# Patient Record
Sex: Male | Born: 1945 | Race: White | Hispanic: No | Marital: Married | Smoking: Former smoker
Health system: Southern US, Community
[De-identification: ages and names within clinical notes are randomized; demographics above are authoritative.]

## PROBLEM LIST (undated history)

## (undated) DIAGNOSIS — M810 Age-related osteoporosis without current pathological fracture: Secondary | ICD-10-CM

## (undated) DIAGNOSIS — J1282 Pneumonia due to coronavirus disease 2019: Secondary | ICD-10-CM

## (undated) DIAGNOSIS — U071 COVID-19: Secondary | ICD-10-CM

## (undated) DIAGNOSIS — R002 Palpitations: Secondary | ICD-10-CM

## (undated) DIAGNOSIS — E119 Type 2 diabetes mellitus without complications: Secondary | ICD-10-CM

## (undated) HISTORY — DX: Age-related osteoporosis without current pathological fracture: M81.0

## (undated) HISTORY — PX: APPENDECTOMY: SHX54

## (undated) HISTORY — DX: Palpitations: R00.2

---

## 2005-07-19 ENCOUNTER — Ambulatory Visit (HOSPITAL_COMMUNITY): Admission: RE | Admit: 2005-07-19 | Discharge: 2005-07-19 | Payer: Self-pay | Admitting: Orthopedic Surgery

## 2005-09-28 ENCOUNTER — Ambulatory Visit (HOSPITAL_COMMUNITY): Admission: RE | Admit: 2005-09-28 | Discharge: 2005-09-28 | Payer: Self-pay | Admitting: Orthopedic Surgery

## 2008-08-06 ENCOUNTER — Inpatient Hospital Stay (HOSPITAL_COMMUNITY): Admission: EM | Admit: 2008-08-06 | Discharge: 2008-08-08 | Payer: Self-pay | Admitting: Emergency Medicine

## 2008-08-08 ENCOUNTER — Encounter (INDEPENDENT_AMBULATORY_CARE_PROVIDER_SITE_OTHER): Payer: Self-pay | Admitting: Gastroenterology

## 2008-12-19 ENCOUNTER — Emergency Department (HOSPITAL_COMMUNITY): Admission: EM | Admit: 2008-12-19 | Discharge: 2008-12-19 | Payer: Self-pay | Admitting: Emergency Medicine

## 2008-12-22 ENCOUNTER — Ambulatory Visit (HOSPITAL_COMMUNITY): Admission: RE | Admit: 2008-12-22 | Discharge: 2008-12-22 | Payer: Self-pay | Admitting: Urology

## 2009-05-02 ENCOUNTER — Encounter: Admission: RE | Admit: 2009-05-02 | Discharge: 2009-05-02 | Payer: Self-pay | Admitting: Specialist

## 2010-05-14 ENCOUNTER — Encounter: Admission: RE | Admit: 2010-05-14 | Discharge: 2010-05-14 | Payer: Self-pay | Admitting: Family Medicine

## 2010-12-22 LAB — COMPREHENSIVE METABOLIC PANEL
ALT: 63 U/L — ABNORMAL HIGH (ref 0–53)
AST: 37 U/L (ref 0–37)
Albumin: 4.1 g/dL (ref 3.5–5.2)
Alkaline Phosphatase: 58 U/L (ref 39–117)
BUN: 19 mg/dL (ref 6–23)
CO2: 28 mEq/L (ref 19–32)
Calcium: 8.8 mg/dL (ref 8.4–10.5)
Chloride: 102 mEq/L (ref 96–112)
Creatinine, Ser: 0.92 mg/dL (ref 0.4–1.5)
GFR calc Af Amer: >60 mL/min (ref >60)
GFR calc non Af Amer: >60 mL/min (ref >60)
Glucose, Bld: 253 mg/dL — ABNORMAL HIGH (ref 70–99)
Potassium: 3.7 mEq/L (ref 3.5–5.1)
Sodium: 137 mEq/L (ref 135–145)
Total Bilirubin: 1.1 mg/dL (ref 0.3–1.2)
Total Protein: 7 g/dL (ref 6.0–8.3)

## 2010-12-22 LAB — CBC
HCT: 44.7 % (ref 39.0–52.0)
Hemoglobin: 15.1 g/dL (ref 13.0–17.0)
MCHC: 33.7 g/dL (ref 30.0–36.0)
MCV: 90.5 fL (ref 78.0–100.0)
Platelets: 167 10*3/uL (ref 150–400)
RBC: 4.94 MIL/uL (ref 4.22–5.81)
RDW: 13.1 % (ref 11.5–15.5)
WBC: 9 10*3/uL (ref 4.0–10.5)

## 2010-12-22 LAB — URINALYSIS, ROUTINE W REFLEX MICROSCOPIC
Bilirubin Urine: NEGATIVE
Glucose, UA: 500 mg/dL — AB
Nitrite: NEGATIVE
Protein, ur: 100 mg/dL — AB
Specific Gravity, Urine: 1.027 (ref 1.005–1.030)
Urobilinogen, UA: 0.2 mg/dL (ref 0.0–1.0)
pH: 6 (ref 5.0–8.0)

## 2010-12-22 LAB — URINE MICROSCOPIC-ADD ON

## 2010-12-22 LAB — DIFFERENTIAL
Basophils Absolute: 0 10*3/uL (ref 0.0–0.1)
Basophils Relative: 1 % (ref 0–1)
Eosinophils Absolute: 0.1 10*3/uL (ref 0.0–0.7)
Eosinophils Relative: 1 % (ref 0–5)
Lymphocytes Relative: 19 % (ref 12–46)
Lymphs Abs: 1.7 10*3/uL (ref 0.7–4.0)
Monocytes Absolute: 0.6 10*3/uL (ref 0.1–1.0)
Monocytes Relative: 7 % (ref 3–12)
Neutro Abs: 6.6 10*3/uL (ref 1.7–7.7)
Neutrophils Relative %: 73 % (ref 43–77)

## 2010-12-22 LAB — GLUCOSE, CAPILLARY: Glucose-Capillary: 207 mg/dL — ABNORMAL HIGH (ref 70–99)

## 2010-12-22 LAB — URINE CULTURE
Colony Count: NO GROWTH
Culture: NO GROWTH

## 2011-01-12 ENCOUNTER — Other Ambulatory Visit: Payer: Self-pay | Admitting: Family Medicine

## 2011-01-12 DIAGNOSIS — N281 Cyst of kidney, acquired: Secondary | ICD-10-CM

## 2011-01-17 ENCOUNTER — Ambulatory Visit (HOSPITAL_COMMUNITY)
Admission: RE | Admit: 2011-01-17 | Discharge: 2011-01-17 | Disposition: A | Discharge status: Home / Self Care | Payer: BC Managed Care – PPO | Source: Ambulatory Visit | Attending: Family Medicine | Admitting: Family Medicine

## 2011-01-17 DIAGNOSIS — K7689 Other specified diseases of liver: Secondary | ICD-10-CM | POA: Insufficient documentation

## 2011-01-17 DIAGNOSIS — K5730 Diverticulosis of large intestine without perforation or abscess without bleeding: Secondary | ICD-10-CM | POA: Insufficient documentation

## 2011-01-17 DIAGNOSIS — N281 Cyst of kidney, acquired: Secondary | ICD-10-CM

## 2011-01-17 DIAGNOSIS — Q619 Cystic kidney disease, unspecified: Secondary | ICD-10-CM | POA: Insufficient documentation

## 2011-01-17 MED ORDER — GADOBENATE DIMEGLUMINE 529 MG/ML IV SOLN
18.0000 mL | Freq: Once | INTRAVENOUS | Status: AC | PRN
  Administered 2011-01-17: 18 mL via INTRAVENOUS

## 2011-01-25 NOTE — Op Note (Signed)
NAMETELVIN, REINDERS             ACCOUNT NO.:  [id number]   MEDICAL RECORD NO.:  [id number]          PATIENT TYPE:  INP   LOCATION:  1419                         FACILITY:  [facility]   PHYSICIAN:  Graylin Shiver, M.D.   DATE OF BIRTH:  [birth year]   DATE OF PROCEDURE:  08/08/2008  DATE OF DISCHARGE:  08/08/2008                               OPERATIVE REPORT   PROCEDURE:  Esophagogastroduodenoscopy with biopsy.   INDICATIONS:  Abdominal pain.  Abdominal CT scan showing possible  gastric wall thickening.   Informed consent was obtained after explanation of the risks of  bleeding, infection and perforation.   PREMEDICATION:  Fentanyl 75 mcg IV, Versed 7 mg IV.   DESCRIPTION OF PROCEDURE:  With the patient in the left lateral  decubitus position, the Pentax gastroscope was inserted into the  oropharynx and passed into the esophagus.  It was advanced down the  esophagus, then into the stomach and into the duodenum.  The second  portion and bulb of the duodenum were normal.  The stomach showed  erythema in the antrum.  There were thick inflamed folds in the body of  the stomach along the greater curvature.  Biopsies were obtained from  the gastric antrum and the thick folds in the stomach.  No lesions were  seen in the fundus or cardia.  The esophagus was normal.  He tolerated  the procedure well without complications.   IMPRESSION:  Gastritis.  Biopsies will be checked.           ______________________________  Graylin Shiver, M.D.     SFG/MEDQ  D:  09/23/2008  T:  09/23/2008  Job:  [id number]

## 2011-01-25 NOTE — H&P (Signed)
NAMERIGGINS, CISEK             ACCOUNT NO.:  [id number]   MEDICAL RECORD NO.:  [id number]          PATIENT TYPE:  EMS   LOCATION:  ED                           FACILITY[facility]  [facility]   PHYSICIAN:  Eduard Clos, MDDATE OF BIRTH:  02-04-1946   DATE OF ADMISSION:  08/05/2008  DATE OF DISCHARGE:                              HISTORY & PHYSICAL   PRIMARY CARE PHYSICIAN:  Unassigned.   CHIEF COMPLAINT:  Abdominal pain.   HISTORY OF PRESENT ILLNESS:  A 65 year old male with no significant past  medical history, who presented with sudden onset of abdominal pain.  Today while at work, the pain was in the left upper quadrant, radiating  all the way back to his left lower quadrant.  Was colicky in nature,  constant, associated with nausea and vomiting.  Denies any diarrhea,  fever, chills, or any dysuria.  Denies any chest pain, shortness of  breath, palpitations, weakness of the limbs, loss of consciousness,  headache.   In the ER, the patient had a CAT scan of the pelvis which showed chronic  diverticulosis and a questionable gastric wall thickening, to rule out  any tumor.   PAST MEDICAL HISTORY:  Nothing significant.   PAST SURGICAL HISTORY:  Appendectomy.   MEDICATIONS PRIOR TO ADMISSION:  None.   ALLERGIES:  No known drug allergies.   SOCIAL HISTORY:  Patient lives with his wife.  Denies smoking any  cigarettes, drinking any alcohol or using illegal drugs.   FAMILY HISTORY:  Nothing contributory.   REVIEW OF SYSTEMS:  As per the history of present illness, nothing else  significant.   PHYSICAL EXAMINATION:  Patient examined at bedside.  Not in acute  distress.  VITAL SIGNS:  Blood pressure is 134/80, pulse 90 per minute, temperature  98.6, respirations 18 per minute, O2 sat 98%.  HEENT:  Anicteric.  No pallor.  CHEST:  Bilateral air entry present bilaterally.  No rhonchi, no  crepitation.  HEART:  S1 and S2 heard.  ABDOMEN:  Soft.  Nontender.  Bowel sounds heard.  No  guarding, no  rigidity.  No discolorations.  CNS:  Awake, alert and oriented to time, place, and person.  Moves upper  and lower extremities 5/5.  EXTREMITIES:  Peripheral pulses felt.  No edema.   LABS:  CT of the abdomen and pelvis shows chronic diverticulosis,  minimal bibasilar atelectasis, questionable gastric wall thickening  versus hydrodistention.  Recommend endoscopy or upper GI exam to exclude  gastric mass or tumor.  descending at sigmoid, colonic diverticulosis  without definitive evidence of diverticulitis.   CBC:  WBC is 12, hemoglobin 15.3, hematocrit 48, platelets 177,  neutrophils 80%.  Complete metabolic panel:  Sodium 139, potassium 3.7,  chloride 102, glucose 285, BUN 19, creatinine 1, total bilirubin 0.7,  direct 0.2, indirect bilirubin 0.5, alkaline phosphatase 61, AST 26, ALT  48, total protein 6.8, albumin 4.  Urine color was amber, it was cloudy,  glucose 1000, ketones 40, blood large, WBCs 0, bacteria rare.   ASSESSMENT:  1. Abdominal pain with no clear etiology.  2. Questionable gastric wall thickening to rule  out tumor.  3. New onset diabetes mellitus type 2.  4. Dehydration.   PLAN:  Will admit patient to the medical floor.  Place patient on IV  fluids.  Clear-liquid diet.  Give a dose of Lantus.  Check his metabolic  panel to make sure there is no anion gap.  Patient will need a GI  consult  need to rule out any gastric wall tumor.  Further  recommendations as patient's condition evolves.      Eduard Clos, MD  Electronically Signed     ANK/MEDQ  D:  08/06/2008  T:  08/06/2008  Job:  [id number]

## 2011-01-25 NOTE — Consult Note (Signed)
NAME:  Christopher Arnold, Christopher Arnold             ACCOUNT NO.:  [id number]   MEDICAL RECORD NO.:  [id number]          PATIENT TYPE:  INP   LOCATION:  1419                         [facility]ACILITY[facility]  [facility]   PHYSICIAN:  Graylin Shiver, M.D.   DATE OF BIRTH:  Feb 05, 1946   DATE OF CONSULTATION:  08/06/2008  DATE OF DISCHARGE:                                 CONSULTATION   We were asked to see this patient in consultation for an abnormal CT  scan by [facility].   HISTORY OF PRESENT ILLNESS:  This is a 65 year old gentleman from the  former [place] who speaks broken [place] and describes left-sided pain  that started suddenly yesterday.  He says it feels like squeezing.  He  states that he had vomiting yesterday but no blood.  He has not had any  vomiting or anything to eat today.  He has not had any change in his  bowel movements.  Says he has regular bowel movements that are easy to  pass and has not seen any blood in them.  He has no history of a  previous ulcer.  He has no history of NSAID use.  He is an ex-smoker.   PAST MEDICAL HISTORY:  Significant for appendectomy.   CURRENT MEDICATIONS:  None.   ALLERGIES:  No known drug allergies.   REVIEW OF SYSTEMS:  Negative except per HPI.   SOCIAL HISTORY:  No alcohol, no drugs.  He is an ex-smoker.   FAMILY HISTORY:  Negative for stomach and colon cancer.   PHYSICAL EXAM:  CONSTITUTIONAL:  He is alert and oriented.  ABDOMEN:  Soft, nontender, nondistended.  He states that now, after his  pain medications, he has no more abdominal pain.  VITAL SIGNS:  Temperature is 98.8, pulse 71, respirations 18, blood  pressure is 129/73.   LABS:  His BMET is within normal limits other than a glucose of 240.  LFTs are normal.  CBC shows a white count of 9.7, that is down from 12  on admission, hemoglobin 14.8, hematocrit 44, platelets 182,000.   CT of his abdomen and pelvis shows:  1. A question of gastric wall thickening versus under distention.  Recommend endoscopy to exclude gastric tumor.  2. This gentleman also has a left inguinal hernia.  3. Diverticulosis in the descending and sigmoid colon without any sign      of diverticulitis.   ASSESSMENT:  Dr. Herbert Moors has seen and examined the patient,  collected a history and reviewed his chart.  His impression is that this  is a 65 year old male who has a gastric abnormality on CT scan.  Will  evaluate his with endoscopy on Friday morning and for PPI therapy in the  meantime.  Thanks very much for this consultation.      Stephani Police, PA    ______________________________  Graylin Shiver, M.D.    MLY/MEDQ  D:  08/06/2008  T:  08/07/2008  Job:  [id number]

## 2011-01-28 NOTE — Discharge Summary (Signed)
Christopher Arnold, Christopher Arnold             ACCOUNT NO.:  [id number]   MEDICAL RECORD NO.:  [id number]          PATIENT TYPE:  INP   LOCATION:  1419                         FACILITY:  [facility]   PHYSICIAN:  Beckey Rutter, MD  DATE OF BIRTH:  [birth year]   DATE OF ADMISSION:  08/05/2008  DATE OF DISCHARGE:  08/08/2008                               DISCHARGE SUMMARY   PRIMARY CARE PHYSICIAN:  Unassigned.   CHIEF COMPLAINT:  Abdominal pain.   HISTORY OF PRESENT ILLNESS:  A 65 year old with no significant history  presented with abdominal pain while at work.  In the emergency  department his CT scan was showing chronic diverticulosis and  questionable gastric wall thickening.   HOSPITAL COURSE:  1. During the hospital stay the patient was kept n.p.o. and he was      evaluated by [facility].  The patient underwent EGD      with [facility].  The result is not available on the      paper chart.  Nevertheless, it was conveyed to the nurse the result      is negative and the patient should continue on PPI for 2 months.  2. Diabetes.  The patient was found to have hyperglycemia.  His A1c is      10.4.  He was started on carbohydrate modified diet.  He had      nutritional evaluation and elected to try lifestyle modification.      Also wanted to follow up with his primary physician to further      address the management of his diabetes and to follow up.  The      patient was seen by diabetic educator and a registered dietician      during hospital stay.   DISCHARGE DIAGNOSES:  1. Abdominal pain resolved.  2. New onset diabetes   DISCHARGE MEDICATIONS:  Prilosec 20 mg p.o. for 2 months.   DISCHARGE/PLAN:  The patient should follow up with [facility] as discussed with him.  The patient was also advised to  follow up with the primary medical doctor within 1 week after discharge  to address the diabetes management.  He is aware and agreeable to  discharge  plan.      Beckey Rutter, MD  Electronically Signed     EME/MEDQ  D:  09/10/2008  T:  09/10/2008  Job:  [id number]

## 2011-06-14 LAB — DIFFERENTIAL
Basophils Absolute: 0.1 10*3/uL (ref 0.0–0.1)
Basophils Relative: 1 % (ref 0–1)
Eosinophils Absolute: 0 10*3/uL (ref 0.0–0.7)
Eosinophils Relative: 0 % (ref 0–5)
Lymphocytes Relative: 13 % (ref 12–46)
Lymphs Abs: 1.6 10*3/uL (ref 0.7–4.0)
Monocytes Absolute: 0.7 10*3/uL (ref 0.1–1.0)
Monocytes Relative: 6 % (ref 3–12)
Neutro Abs: 9.6 10*3/uL — ABNORMAL HIGH (ref 1.7–7.7)
Neutrophils Relative %: 80 % — ABNORMAL HIGH (ref 43–77)

## 2011-06-14 LAB — TSH: TSH: 1.499 u[IU]/mL (ref 0.350–4.500)

## 2011-06-14 LAB — COMPREHENSIVE METABOLIC PANEL WITH GFR
ALT: 41 U/L (ref 0–53)
AST: 20 U/L (ref 0–37)
Albumin: 3.5 g/dL (ref 3.5–5.2)
Calcium: 8.4 mg/dL (ref 8.4–10.5)
Creatinine, Ser: 0.88 mg/dL (ref 0.4–1.5)
GFR calc Af Amer: >60 mL/min (ref >60)
GFR calc non Af Amer: >60 mL/min (ref >60)
Sodium: 138 meq/L (ref 135–145)
Total Protein: 6 g/dL (ref 6.0–8.3)

## 2011-06-14 LAB — GLUCOSE, CAPILLARY
Glucose-Capillary: 101 mg/dL — ABNORMAL HIGH (ref 70–99)
Glucose-Capillary: 108 mg/dL — ABNORMAL HIGH (ref 70–99)
Glucose-Capillary: 108 mg/dL — ABNORMAL HIGH (ref 70–99)
Glucose-Capillary: 111 mg/dL — ABNORMAL HIGH (ref 70–99)
Glucose-Capillary: 124 mg/dL — ABNORMAL HIGH (ref 70–99)
Glucose-Capillary: 128 mg/dL — ABNORMAL HIGH (ref 70–99)
Glucose-Capillary: 138 mg/dL — ABNORMAL HIGH (ref 70–99)
Glucose-Capillary: 142 mg/dL — ABNORMAL HIGH (ref 70–99)
Glucose-Capillary: 145 mg/dL — ABNORMAL HIGH (ref 70–99)
Glucose-Capillary: 169 mg/dL — ABNORMAL HIGH (ref 70–99)
Glucose-Capillary: 170 mg/dL — ABNORMAL HIGH (ref 70–99)
Glucose-Capillary: 174 mg/dL — ABNORMAL HIGH (ref 70–99)
Glucose-Capillary: 204 mg/dL — ABNORMAL HIGH (ref 70–99)
Glucose-Capillary: 90 mg/dL (ref 70–99)
Glucose-Capillary: 99 mg/dL (ref 70–99)

## 2011-06-14 LAB — BASIC METABOLIC PANEL
CO2: 31 mEq/L (ref 19–32)
Chloride: 103 mEq/L (ref 96–112)
Creatinine, Ser: 0.96 mg/dL (ref 0.4–1.5)
GFR calc Af Amer: >60 mL/min (ref >60)
Potassium: 3.8 mEq/L (ref 3.5–5.1)

## 2011-06-14 LAB — URINALYSIS, ROUTINE W REFLEX MICROSCOPIC
Bilirubin Urine: NEGATIVE
Ketones, ur: 40 mg/dL — AB
Nitrite: NEGATIVE
pH: 5.5 (ref 5.0–8.0)

## 2011-06-14 LAB — CBC
HCT: 43.4 % (ref 39.0–52.0)
HCT: 44 % (ref 39.0–52.0)
HCT: 47 % (ref 39.0–52.0)
Hemoglobin: 14.8 g/dL (ref 13.0–17.0)
Hemoglobin: 15.3 g/dL (ref 13.0–17.0)
MCHC: 32.5 g/dL (ref 30.0–36.0)
MCHC: 33.5 g/dL (ref 30.0–36.0)
MCHC: 34.3 g/dL (ref 30.0–36.0)
MCV: 90.9 fL (ref 78.0–100.0)
MCV: 91.6 fL (ref 78.0–100.0)
MCV: 92.3 fL (ref 78.0–100.0)
Platelets: 177 10*3/uL (ref 150–400)
Platelets: 182 K/uL (ref 150–400)
RBC: 4.77 MIL/uL (ref 4.22–5.81)
RBC: 4.77 MIL/uL (ref 4.22–5.81)
RBC: 5.14 MIL/uL (ref 4.22–5.81)
RDW: 12.4 % (ref 11.5–15.5)
RDW: 12.9 % (ref 11.5–15.5)
WBC: 12 10*3/uL — ABNORMAL HIGH (ref 4.0–10.5)
WBC: 6.5 10*3/uL (ref 4.0–10.5)
WBC: 9.7 10*3/uL (ref 4.0–10.5)

## 2011-06-14 LAB — HEPATIC FUNCTION PANEL
ALT: 48 U/L (ref 0–53)
AST: 26 U/L (ref 0–37)
Albumin: 4 g/dL (ref 3.5–5.2)
Alkaline Phosphatase: 61 U/L (ref 39–117)
Bilirubin, Direct: 0.2 mg/dL (ref 0.0–0.3)
Indirect Bilirubin: 0.5 mg/dL (ref 0.3–0.9)
Total Bilirubin: 0.7 mg/dL (ref 0.3–1.2)
Total Protein: 6.8 g/dL (ref 6.0–8.3)

## 2011-06-14 LAB — POCT I-STAT, CHEM 8
BUN: 19 mg/dL (ref 6–23)
Calcium, Ion: 1.06 mmol/L — ABNORMAL LOW (ref 1.12–1.32)
Chloride: 102 mEq/L (ref 96–112)
Creatinine, Ser: 1 mg/dL (ref 0.4–1.5)
Glucose, Bld: 285 mg/dL — ABNORMAL HIGH (ref 70–99)
HCT: 48 % (ref 39.0–52.0)
Hemoglobin: 16.3 g/dL (ref 13.0–17.0)
Potassium: 3.7 mEq/L (ref 3.5–5.1)
Sodium: 139 mEq/L (ref 135–145)
TCO2: 25 mmol/L (ref 0–100)

## 2011-06-14 LAB — COMPREHENSIVE METABOLIC PANEL
Alkaline Phosphatase: 55 U/L (ref 39–117)
BUN: 16 mg/dL (ref 6–23)
CO2: 26 mEq/L (ref 19–32)
Chloride: 105 mEq/L (ref 96–112)
Glucose, Bld: 240 mg/dL — ABNORMAL HIGH (ref 70–99)
Potassium: 4.1 mEq/L (ref 3.5–5.1)
Total Bilirubin: 0.8 mg/dL (ref 0.3–1.2)

## 2011-06-14 LAB — LIPID PANEL
Cholesterol: 171 mg/dL (ref 0–200)
HDL: 44 mg/dL (ref >39)
LDL Cholesterol: 113 mg/dL — ABNORMAL HIGH (ref 0–99)
Total CHOL/HDL Ratio: 3.9 RATIO
Triglycerides: 72 mg/dL (ref <150)
VLDL: 14 mg/dL (ref 0–40)

## 2011-06-14 LAB — CARDIAC PANEL(CRET KIN+CKTOT+MB+TROPI)
CK, MB: 1.5 ng/mL (ref 0.3–4.0)
CK, MB: 1.9 ng/mL (ref 0.3–4.0)
Relative Index: 1 (ref 0.0–2.5)
Total CK: 154 U/L (ref 7–232)
Troponin I: 0.01 ng/mL (ref 0.00–0.06)

## 2011-06-14 LAB — URINE MICROSCOPIC-ADD ON

## 2011-06-14 LAB — HEMOGLOBIN A1C
Hgb A1c MFr Bld: 10.4 % — ABNORMAL HIGH (ref 4.6–6.1)
Mean Plasma Glucose: 252 mg/dL

## 2011-06-14 LAB — LIPASE, BLOOD: Lipase: 13 U/L (ref 11–59)

## 2012-12-11 ENCOUNTER — Other Ambulatory Visit: Payer: Self-pay | Admitting: Gastroenterology

## 2012-12-11 DIAGNOSIS — R1012 Left upper quadrant pain: Secondary | ICD-10-CM

## 2012-12-13 ENCOUNTER — Ambulatory Visit
Admission: RE | Admit: 2012-12-13 | Discharge: 2012-12-13 | Disposition: A | Discharge status: Home / Self Care | Payer: BC Managed Care – PPO | Source: Ambulatory Visit | Attending: Gastroenterology | Admitting: Gastroenterology

## 2012-12-13 DIAGNOSIS — R1012 Left upper quadrant pain: Secondary | ICD-10-CM

## 2012-12-13 MED ORDER — IOHEXOL 300 MG/ML  SOLN
100.0000 mL | Freq: Once | INTRAMUSCULAR | Status: AC | PRN
  Administered 2012-12-13: 100 mL via INTRAVENOUS

## 2014-03-22 ENCOUNTER — Ambulatory Visit (HOSPITAL_COMMUNITY): Admit: 2014-03-22 | Payer: Self-pay | Admitting: Cardiovascular Disease

## 2014-03-22 ENCOUNTER — Encounter (HOSPITAL_COMMUNITY)
Admission: EM | Disposition: A | Discharge status: Home / Self Care | Payer: BC Managed Care – PPO | Source: Home / Self Care | Attending: Family Medicine

## 2014-03-22 ENCOUNTER — Emergency Department (HOSPITAL_COMMUNITY): Payer: 59

## 2014-03-22 ENCOUNTER — Inpatient Hospital Stay (HOSPITAL_COMMUNITY)
Admission: EM | Admit: 2014-03-22 | Discharge: 2014-03-26 | DRG: 419 | Disposition: A | Discharge status: Home / Self Care | Payer: 59 | Attending: Family Medicine | Admitting: Family Medicine

## 2014-03-22 ENCOUNTER — Encounter (HOSPITAL_COMMUNITY): Payer: Self-pay | Admitting: Emergency Medicine

## 2014-03-22 ENCOUNTER — Inpatient Hospital Stay (HOSPITAL_COMMUNITY): Payer: 59

## 2014-03-22 DIAGNOSIS — E119 Type 2 diabetes mellitus without complications: Secondary | ICD-10-CM | POA: Diagnosis present

## 2014-03-22 DIAGNOSIS — Z87898 Personal history of other specified conditions: Secondary | ICD-10-CM | POA: Diagnosis present

## 2014-03-22 DIAGNOSIS — D72829 Elevated white blood cell count, unspecified: Secondary | ICD-10-CM | POA: Diagnosis present

## 2014-03-22 DIAGNOSIS — K2980 Duodenitis without bleeding: Secondary | ICD-10-CM | POA: Diagnosis present

## 2014-03-22 DIAGNOSIS — R079 Chest pain, unspecified: Secondary | ICD-10-CM

## 2014-03-22 DIAGNOSIS — R509 Fever, unspecified: Secondary | ICD-10-CM | POA: Diagnosis present

## 2014-03-22 DIAGNOSIS — Z87891 Personal history of nicotine dependence: Secondary | ICD-10-CM | POA: Diagnosis not present

## 2014-03-22 DIAGNOSIS — I209 Angina pectoris, unspecified: Secondary | ICD-10-CM | POA: Diagnosis present

## 2014-03-22 DIAGNOSIS — E1165 Type 2 diabetes mellitus with hyperglycemia: Secondary | ICD-10-CM

## 2014-03-22 DIAGNOSIS — K8020 Calculus of gallbladder without cholecystitis without obstruction: Secondary | ICD-10-CM | POA: Diagnosis not present

## 2014-03-22 DIAGNOSIS — I739 Peripheral vascular disease, unspecified: Secondary | ICD-10-CM | POA: Diagnosis not present

## 2014-03-22 DIAGNOSIS — K810 Acute cholecystitis: Secondary | ICD-10-CM

## 2014-03-22 DIAGNOSIS — K7689 Other specified diseases of liver: Secondary | ICD-10-CM | POA: Diagnosis not present

## 2014-03-22 DIAGNOSIS — I2111 ST elevation (STEMI) myocardial infarction involving right coronary artery: Secondary | ICD-10-CM

## 2014-03-22 DIAGNOSIS — I219 Acute myocardial infarction, unspecified: Secondary | ICD-10-CM | POA: Diagnosis not present

## 2014-03-22 DIAGNOSIS — I208 Other forms of angina pectoris: Secondary | ICD-10-CM

## 2014-03-22 DIAGNOSIS — I509 Heart failure, unspecified: Secondary | ICD-10-CM | POA: Diagnosis not present

## 2014-03-22 DIAGNOSIS — K8000 Calculus of gallbladder with acute cholecystitis without obstruction: Secondary | ICD-10-CM | POA: Diagnosis present

## 2014-03-22 HISTORY — DX: Type 2 diabetes mellitus without complications: E11.9

## 2014-03-22 HISTORY — PX: LEFT HEART CATH: SHX5478

## 2014-03-22 LAB — BASIC METABOLIC PANEL
Anion gap: 15 (ref 5–15)
BUN: 20 mg/dL (ref 6–23)
CALCIUM: 9.8 mg/dL (ref 8.4–10.5)
CO2: 23 mEq/L (ref 19–32)
Chloride: 99 mEq/L (ref 96–112)
Creatinine, Ser: 0.77 mg/dL (ref 0.50–1.35)
GFR calc Af Amer: >90 mL/min (ref >90)
GLUCOSE: 286 mg/dL — AB (ref 70–99)
POTASSIUM: 4.1 meq/L (ref 3.7–5.3)
Sodium: 137 mEq/L (ref 137–147)

## 2014-03-22 LAB — COMPREHENSIVE METABOLIC PANEL
ALBUMIN: 3.9 g/dL (ref 3.5–5.2)
ALK PHOS: 69 U/L (ref 39–117)
ALT: 37 U/L (ref 0–53)
AST: 35 U/L (ref 0–37)
Anion gap: 16 — ABNORMAL HIGH (ref 5–15)
BUN: 17 mg/dL (ref 6–23)
CHLORIDE: 100 meq/L (ref 96–112)
CO2: 24 mEq/L (ref 19–32)
Calcium: 9.1 mg/dL (ref 8.4–10.5)
Creatinine, Ser: 0.63 mg/dL (ref 0.50–1.35)
GFR calc Af Amer: >90 mL/min (ref >90)
GFR calc non Af Amer: >90 mL/min (ref >90)
Glucose, Bld: 234 mg/dL — ABNORMAL HIGH (ref 70–99)
Potassium: 4.2 mEq/L (ref 3.7–5.3)
SODIUM: 140 meq/L (ref 137–147)
TOTAL PROTEIN: 7 g/dL (ref 6.0–8.3)
Total Bilirubin: 0.4 mg/dL (ref 0.3–1.2)

## 2014-03-22 LAB — TROPONIN I
Troponin I: <0.3 ng/mL (ref <0.30)
Troponin I: <0.3 ng/mL (ref <0.30)

## 2014-03-22 LAB — CBC
HEMATOCRIT: 43.4 % (ref 39.0–52.0)
HEMATOCRIT: 41.8 % (ref 39.0–52.0)
HEMOGLOBIN: 14.8 g/dL (ref 13.0–17.0)
HEMOGLOBIN: 14.2 g/dL (ref 13.0–17.0)
MCH: 30 pg (ref 26.0–34.0)
MCH: 30 pg (ref 26.0–34.0)
MCHC: 34.1 g/dL (ref 30.0–36.0)
MCHC: 34 g/dL (ref 30.0–36.0)
MCV: 87.9 fL (ref 78.0–100.0)
MCV: 88.4 fL (ref 78.0–100.0)
Platelets: 175 10*3/uL (ref 150–400)
Platelets: 173 10*3/uL (ref 150–400)
RBC: 4.94 MIL/uL (ref 4.22–5.81)
RBC: 4.73 MIL/uL (ref 4.22–5.81)
RDW: 12.5 % (ref 11.5–15.5)
RDW: 12.6 % (ref 11.5–15.5)
WBC: 13.4 10*3/uL — ABNORMAL HIGH (ref 4.0–10.5)
WBC: 13.9 10*3/uL — ABNORMAL HIGH (ref 4.0–10.5)

## 2014-03-22 LAB — PROTIME-INR
INR: 0.99 (ref 0.00–1.49)
Prothrombin Time: 13.1 seconds (ref 11.6–15.2)

## 2014-03-22 LAB — I-STAT TROPONIN, ED: Troponin i, poc: 0.03 ng/mL (ref 0.00–0.08)

## 2014-03-22 LAB — GLUCOSE, CAPILLARY
Glucose-Capillary: 233 mg/dL — ABNORMAL HIGH (ref 70–99)
Glucose-Capillary: 194 mg/dL — ABNORMAL HIGH (ref 70–99)
Glucose-Capillary: 136 mg/dL — ABNORMAL HIGH (ref 70–99)

## 2014-03-22 LAB — HEMOGLOBIN A1C
Hgb A1c MFr Bld: 7.6 % — ABNORMAL HIGH (ref <5.7)
Mean Plasma Glucose: 171 mg/dL — ABNORMAL HIGH (ref <117)

## 2014-03-22 LAB — AMYLASE: Amylase: 25 U/L (ref 0–105)

## 2014-03-22 LAB — APTT: APTT: 29 s (ref 24–37)

## 2014-03-22 LAB — MRSA PCR SCREENING: MRSA BY PCR: NEGATIVE

## 2014-03-22 LAB — LIPASE, BLOOD: Lipase: 17 U/L (ref 11–59)

## 2014-03-22 SURGERY — LEFT HEART CATH
Anesthesia: LOCAL

## 2014-03-22 MED ORDER — NITROGLYCERIN 0.4 MG SL SUBL
SUBLINGUAL_TABLET | SUBLINGUAL | Status: AC
  Filled 2014-03-22: qty 1

## 2014-03-22 MED ORDER — INSULIN ASPART 100 UNIT/ML ~~LOC~~ SOLN
0.0000 [IU] | Freq: Three times a day (TID) | SUBCUTANEOUS | Status: DC
  Administered 2014-03-22: 5 [IU] via SUBCUTANEOUS
  Administered 2014-03-22: 3 [IU] via SUBCUTANEOUS
  Administered 2014-03-23: 5 [IU] via SUBCUTANEOUS
  Administered 2014-03-24: 3 [IU] via SUBCUTANEOUS
  Administered 2014-03-24: 2 [IU] via SUBCUTANEOUS
  Administered 2014-03-24 – 2014-03-25 (×3): 3 [IU] via SUBCUTANEOUS

## 2014-03-22 MED ORDER — HEPARIN (PORCINE) IN NACL 100-0.45 UNIT/ML-% IJ SOLN
1300.0000 [IU]/h | INTRAMUSCULAR | Status: DC
  Filled 2014-03-22 (×2): qty 250

## 2014-03-22 MED ORDER — SODIUM CHLORIDE 0.9 % IV SOLN
1.0000 mL/kg/h | INTRAVENOUS | Status: DC
  Administered 2014-03-23 – 2014-03-25 (×2): 1 mL/kg/h via INTRAVENOUS

## 2014-03-22 MED ORDER — ASPIRIN 81 MG PO CHEW
CHEWABLE_TABLET | ORAL | Status: AC
  Filled 2014-03-22: qty 4

## 2014-03-22 MED ORDER — ACETAMINOPHEN 325 MG PO TABS
650.0000 mg | ORAL_TABLET | ORAL | Status: DC | PRN
  Filled 2014-03-22: qty 2

## 2014-03-22 MED ORDER — ONDANSETRON HCL 4 MG/2ML IJ SOLN: 4.0000 mg | Freq: Four times a day (QID) | INTRAMUSCULAR | Status: DC | PRN

## 2014-03-22 MED ORDER — MORPHINE SULFATE 2 MG/ML IJ SOLN
INTRAMUSCULAR | Status: AC
  Administered 2014-03-22: 2 mg via INTRAVENOUS
  Filled 2014-03-22: qty 1

## 2014-03-22 MED ORDER — HEPARIN SODIUM (PORCINE) 5000 UNIT/ML IJ SOLN
60.0000 [IU]/kg | Freq: Once | INTRAMUSCULAR | Status: AC
  Administered 2014-03-22: 4000 [IU] via INTRAVENOUS

## 2014-03-22 MED ORDER — ASPIRIN 81 MG PO CHEW
324.0000 mg | CHEWABLE_TABLET | Freq: Once | ORAL | Status: AC
  Administered 2014-03-22: 324 mg via ORAL

## 2014-03-22 MED ORDER — OXYCODONE-ACETAMINOPHEN 5-325 MG PO TABS
1.0000 | ORAL_TABLET | ORAL | Status: DC | PRN
  Administered 2014-03-23: 2 via ORAL
  Administered 2014-03-23: 1 via ORAL
  Administered 2014-03-23 – 2014-03-25 (×2): 2 via ORAL
  Filled 2014-03-22 (×2): qty 2
  Filled 2014-03-22: qty 1
  Filled 2014-03-22: qty 2

## 2014-03-22 MED ORDER — METOPROLOL TARTRATE 1 MG/ML IV SOLN
INTRAVENOUS | Status: AC
  Filled 2014-03-22: qty 5

## 2014-03-22 MED ORDER — MORPHINE SULFATE 2 MG/ML IJ SOLN
2.0000 mg | Freq: Once | INTRAMUSCULAR | Status: AC
  Administered 2014-03-22: 2 mg via INTRAVENOUS

## 2014-03-22 MED ORDER — MORPHINE SULFATE 2 MG/ML IJ SOLN
2.0000 mg | INTRAMUSCULAR | Status: DC | PRN
  Administered 2014-03-22 – 2014-03-24 (×8): 2 mg via INTRAVENOUS
  Filled 2014-03-22 (×9): qty 1

## 2014-03-22 MED ORDER — HEPARIN SODIUM (PORCINE) 5000 UNIT/ML IJ SOLN
5000.0000 [IU] | Freq: Three times a day (TID) | INTRAMUSCULAR | Status: DC
  Administered 2014-03-23 – 2014-03-24 (×6): 5000 [IU] via SUBCUTANEOUS
  Filled 2014-03-22 (×14): qty 1

## 2014-03-22 MED ORDER — MORPHINE SULFATE 2 MG/ML IJ SOLN
INTRAMUSCULAR | Status: AC
  Filled 2014-03-22: qty 1

## 2014-03-22 MED ORDER — NITROGLYCERIN 0.4 MG SL SUBL: 0.4000 mg | SUBLINGUAL_TABLET | SUBLINGUAL | Status: DC | PRN

## 2014-03-22 NOTE — ED Notes (Signed)
He tells he he experienced abrupt onset of upper abd. Pain at ~0500 this morning which persists.  This is not associated with shortness of breath/nor any n/v/d or diaphoresis.  Currently he points to epigastrium and grimaces frequently as if in much pain.

## 2014-03-22 NOTE — ED Notes (Signed)
His skin remains pale, warm and dry and he is in no distress; although he tells [place] he is still "uncomfortable" and points at epigastrium.  [facility] has been notified of STEMI and is en route.

## 2014-03-22 NOTE — H&P (Signed)
History and Physical  Patient ID: Davante Gerke MRN: [id number], SOB: 1946/08/15 68 y.o. Date of Encounter: 03/22/2014, 11:18 AM  Primary Physician: No primary provider on file. Primary Cardiologist: none  Chief Complaint: Chest/epigastric pain  HPI: 68 y.o. male w/ PMHx significant for Type 2 diabetes who presented to [facility] on 03/22/2014 with complaints of chest pain.  He initially presented to [facility] with complaints of chest pain that started this morning. He describes a pressure like sensation in the epigastrium and lower chest, rated at 10/10 in severity. Pain onset was 5 AM. There were no prodromal symptoms. He did admit to diaphoresis but denied associated nausea, vomiting, or dyspnea. He's had no past history of similar symptoms. He has not been hospitalized or had other medical problems that he admits to. There is no history of stroke or TIA. He is a former smoker but quit several years ago. He drinks alcohol socially but not in excess.   Past Medical History  Diagnosis Date  . Diabetes mellitus without complication      Surgical History:  Past Surgical History  Procedure Laterality Date  . Appendectomy  remote     Home Meds: Prior to Admission medications   Medication Sig Start Date End Date Taking? Authorizing Provider  naproxen (NAPROSYN) 500 MG tablet Take 500 mg by mouth 2 (two) times daily with a meal.   Yes Historical Provider, MD    Allergies: No Known Allergies  History   Social History  . Marital Status: Married    Spouse Name: N/A    Number of Children: N/A  . Years of Education: N/A   Occupational History  . Not on file.   Social History Main Topics  . Smoking status: Former Research scientist (life sciences)  . Smokeless tobacco: Not on file  . Alcohol Use: No  . Drug Use: No  . Sexual Activity: Not on file   Other Topics Concern  . Not on file   Social History Narrative   The patient is married. He is a retired Forensic psychologist. He is  originally from [place]. He has a history of tobacco and alcohol use, but no longer smokes and drinks alcohol only in social settings.     History reviewed. No pertinent family history. The patient denies any family history of coronary artery disease or myocardial infarction in first degree relatives.  Review of Systems: General: negative for chills, fever, night sweats or weight changes.  ENT: negative for rhinorrhea or epistaxis Cardiovascular: see HPI Dermatological: negative for rash Respiratory: negative for cough or wheezing GI: negative for nausea, vomiting, diarrhea, bright red blood per rectum, melena, or hematemesis. Positive for abdominal pain. GU: no hematuria, urgency, or frequency Neurologic: negative for visual changes, syncope, headache, or dizziness Heme: no easy bruising or bleeding Endo: negative for excessive thirst, thyroid disorder, or flushing Musculoskeletal: negative for joint pain or swelling, negative for myalgias All other systems reviewed and are otherwise negative except as noted above.  Physical Exam: Blood pressure 147/67, pulse 56, resp. rate 19, height 5\' 8"  (1.727 m), weight 176 lb 5.9 oz (80 kg), SpO2 100.00%. General: Well developed, well nourished, alert and oriented, in mild distress secondary to pain, stoic. HEENT: Normocephalic, atraumatic, sclera non-icteric, no xanthomas, nares are without discharge.  Neck: Supple. Carotids 2+ without bruits. JVP normal Lungs: Clear bilaterally to auscultation without wheezes, rales, or rhonchi. Breathing is unlabored. Heart: RRR with normal S1 and S2. No murmurs, rubs, or gallops appreciated. Abdomen: Soft,  non-tender, non-distended with normoactive bowel sounds. No hepatomegaly. No rebound/guarding. No obvious abdominal masses. Back: No CVA tenderness Abdomen: soft, positive bowel sounds, tender over the epigastrium Msk:  Strength and tone appear normal for age. Extremities: No clubbing, cyanosis, or edema.   Distal pedal pulses are 2+ and equal bilaterally. Neuro: CNII-XII intact, moves all extremities spontaneously. Psych:  Responds to questions appropriately with a normal affect.   Labs:   Lab Results  Component Value Date   WBC 13.4* 03/22/2014   HGB 14.8 03/22/2014   HCT 43.4 03/22/2014   MCV 87.9 03/22/2014   PLT 175 03/22/2014     Recent Labs Lab 03/22/14 0942  NA 137  K 4.1  CL 99  CO2 23  BUN 20  CREATININE 0.77  CALCIUM 9.8  GLUCOSE 286*    Recent Labs  03/22/14 0955  TROPONINI <0.30   Lab Results  Component Value Date   CHOL  Value: 171        ATP III CLASSIFICATION:  <200     mg/dL   Desirable  200-239  mg/dL   Borderline High  >=240    mg/dL   High 08/06/2008   HDL 44 08/06/2008   LDLCALC  Value: 113        Total Cholesterol/HDL:CHD Risk Coronary Heart Disease Risk Table                     Men   Women  1/2 Average Risk   3.4   3.3* 08/06/2008   TRIG 72 08/06/2008   No results found for this basename: DDIMER    Radiology/Studies:  Dg Chest Port 1 View  03/22/2014   CLINICAL DATA:  Chest pain  EXAM: PORTABLE CHEST - 1 VIEW  COMPARISON:  None.  FINDINGS: Cardiac shadow is mildly enlarged. Mild vascular congestion is seen. No focal infiltrate or sizable effusion is noted.  IMPRESSION: Mild CHF.   Electronically Signed   By: Inez Catalina M.D.   On: 03/22/2014 10:09     EKG: Sinus brady with subtle inferior ST segment elevation  ASSESSMENT AND PLAN:  1. Chest and epigastric pain with borderline EKG concerning for inferior STEMI 2. Type 2 diabetes on oral hypoglycemics  Plan emergent cardiac catheterization and possible PCI. I have reviewed the risks, indications, and alternatives with the patient who understands and agrees to proceed. Emergency informed consent was obtained. Further disposition pending the results of his heart catheterization. If he is not found to have coronary artery disease, will plan on aortography to rule out dissection.  Deatra James  03/22/2014, 11:18 AM

## 2014-03-22 NOTE — Progress Notes (Signed)
ANTICOAGULATION CONSULT NOTE - Initial Consult  Pharmacy Consult for Heparin Indication: chest pain/ACS, Code STEMI  No Known Allergies  Patient Measurements: Height: 5\' 8"  (172.7 cm) Weight: 176 lb 5.9 oz (80 kg) IBW/kg (Calculated) : 68.4 Heparin Dosing Weight: 80 kg  Vital Signs: BP: 151/69 mmHg (07/11 0933) Pulse Rate: 51 (07/11 0933)  Labs:  Recent Labs  03/22/14 0942  HGB 14.8  HCT 43.4  PLT 175    Estimated Creatinine Clearance: 76.4 ml/min (by C-G formula based on Cr of 0.92).   Medical History: Past Medical History  Diagnosis Date  . Diabetes mellitus without complication     Medications:  Scheduled:  . metoprolol      . nitroGLYCERIN       Infusions:  . heparin     PRN:   Assessment: 68 yo male former smoke with hx diabetes presents with substernal chest pain. ECG shows sinus bradycardia, R-wave progression and minimal ST elevation in inferior leads. IV heparin 4000 units IV bolus given in ED, and continued dosing per Pharmacy protocol ordered.   Baseline PTT, PT/INR pending  No hx anticoagulant or antiplatelet use PTA - aspirin 324mg  given in ED  H/H wnl, Plts at low-end of normal  Heparin dosing weight 80kg  Goal of Therapy:  Heparin level 0.3-0.7 units/ml Monitor platelets by anticoagulation protocol: Yes   Plan:   Heparin 4000 units IV bolus already given in ED  Begin heparin infusion 1300 units/hr  Patient going to [facility] for emergent cath - will f/u plans afterward   Peggyann Juba, PharmD, BCPS Pager: [phone] 03/22/2014,10:01 AM

## 2014-03-22 NOTE — CV Procedure (Signed)
    Cardiac Catheterization Procedure Note  Name: Christopher Arnold MRN: [id number] DOB: February 16, 1946  Procedure: Left Heart Cath, Selective Coronary Angiography, LV angiography, aortic angiography  Indication: Substernal chest pain, borderline EKG with concern for inferior STEMI, but nondiagnostic ST segment elevation. This is a 68 year old diabetic gentleman who presented to the emergency department with severe chest and abdominal pain that he rates at 10/10. He is diaphoretic. With a borderline EKG and strong risk profile for CAD, he was brought urgently for cardiac catheterization and possible PCI.   Procedural Details: The right wrist was prepped, draped, and anesthetized with 1% lidocaine. Using the modified Seldinger technique, a 5/6 French slender sheath was introduced into the right radial artery. 3 mg of verapamil was administered through the sheath, weight-based unfractionated heparin was administered intravenously. Standard Judkins catheters were used for selective coronary angiography and left ventriculography. Aortography was performed with the pigtail catheter positioned in the descending thoracic aorta and imaging down to the iliac bifurcation. Catheter exchanges were performed over an exchange length guidewire. There were no immediate procedural complications. A TR band was used for radial hemostasis at the completion of the procedure.  The patient was transferred to the post catheterization recovery area for further monitoring.  Procedural Findings: Hemodynamics: AO 126/66 LV 125/10  Coronary angiography: Coronary dominance: right  Left mainstem: The left main stem is widely patent. It trifurcates into the LAD, intermediate branch, and left circumflex.  Left anterior descending (LAD): The LAD courses to the left ventricular apex. 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 The vessel is large throughout its course with mild irregularity but no stenoses identified. The  diagonal branches are patent without stenosis.  Left circumflex (LCx): The left circumflex supplies a large intermediate branch with no stenosis. The AV circumflex is patent and supplies 2 obtuse marginal branches without significant disease.  Right coronary artery (RCA): The RCA is dominant. The vessel has no obstructive disease. It supplies an acute marginal, PDA, and PLA branches. There is no disease noted throughout the right coronary artery distribution.  Left ventriculography: Left ventricular systolic function is normal, LVEF is estimated at 55-65%, there is no significant mitral regurgitation   Thoracic aortography: The thoracic aorta and abdominal aorta demonstrate no significant obstructive disease or evidence of aneurysm/dissection. The aortoiliac bifurcation is well visualized with calcification but no evidence of dissection or stenosis.  Final Conclusions:   1. Minimal coronary irregularity with widely patent coronary arteries 2. Normal left ventricular function 3. No evidence of thoracoabdominal aortic aneurysm or dissection  Recommendations: Suspect noncardiac pain. I have called a GI consult and discussed this patient's case with Dr. Collene Mares, who has recommended an abdominal ultrasound. She will see him today.  Sherren Mocha 03/22/2014, 11:19 AM

## 2014-03-22 NOTE — Consult Note (Signed)
Unassigned patient-Cross cover [employer] Reason for Consult: Epigastric pain/Chest pain. Referring Physician: Sherren Mocha, MD  Christopher Arnold is an 68 y.o. male.  HPI: 68 year old male, a native of [place], woke up early this morning, at about 5 AM with RUQ pain and epigastric pain radiating to the left side of his chest; taken to the cath lab with concerns for an inferior STEMI and had a left heart cath with angiography that revealed widely patent coronaries and normal LVEF with no evidence of aneurysms or dissection. Patient denies having any nausea, vomiting or back pain with abdominal pain. He has never had such symptoms before. He has a 8 year  history of DM and claims he is very careful with his diet. He denies having any diarrhea or constipation, melena or hematochezia. He has a good appetite and his weight has been stable.   Past Medical History  Diagnosis Date  . Diabetes mellitus without complication    Past Surgical History  Procedure Laterality Date  . Appendectomy  remote   History reviewed. No pertinent family history.  Social History:  reports that he has quit smoking. He does not have any smokeless tobacco history on file. He reports that he does not drink alcohol or use illicit drugs. He moved to the [place] about 17 years ago after the Civil War in his country. He has one son who is a Education administrator at [employer]. 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  Allergies: No Known Allergies  Medications: I have reviewed the patient's current medications.  Results for orders placed during the hospital encounter of 03/22/14 (from the past 48 hour(s))  CBC     Status: Abnormal   Collection Time    03/22/14  9:42 AM      Result Value Ref Range   WBC 13.4 (*) 4.0 - 10.5 K/uL   RBC 4.94  4.22 - 5.81 MIL/uL   Hemoglobin 14.8  13.0 - 17.0 g/dL   HCT 43.4  39.0 - 52.0 %   MCV 87.9  78.0 - 100.0 fL   MCH 30.0  26.0 - 34.0 pg   MCHC 34.1  30.0 -  36.0 g/dL   RDW 12.5  11.5 - 15.5 %   Platelets 175  150 - 400 K/uL  BASIC METABOLIC PANEL     Status: Abnormal   Collection Time    03/22/14  9:42 AM      Result Value Ref Range   Sodium 137  137 - 147 mEq/L   Potassium 4.1  3.7 - 5.3 mEq/L   Chloride 99  96 - 112 mEq/L   CO2 23  19 - 32 mEq/L   Glucose, Bld 286 (*) 70 - 99 mg/dL   BUN 20  6 - 23 mg/dL   Creatinine, Ser 0.77  0.50 - 1.35 mg/dL   Calcium 9.8  8.4 - 10.5 mg/dL   GFR calc non Af Amer >90  >90 mL/min   GFR calc Af Amer >90  >90 mL/min   Comment: (NOTE)     The eGFR has been calculated using the CKD EPI equation.     This calculation has not been validated in all clinical situations.     eGFR's persistently <90 mL/min signify possible Chronic Kidney     Disease.   Anion gap 15  5 - 15  I-STAT TROPOININ, ED     Status: None   Collection Time    03/22/14  9:48  AM      Result Value Ref Range   Troponin i, poc 0.03  0.00 - 0.08 ng/mL   Comment 3            Comment: Due to the release kinetics of cTnI,     a negative result within the first hours     of the onset of symptoms does not rule out     myocardial infarction with certainty.     If myocardial infarction is still suspected,     repeat the test at appropriate intervals.  TROPONIN I     Status: None   Collection Time    03/22/14  9:55 AM      Result Value Ref Range   Troponin I <0.30  <0.30 ng/mL   Comment:            Due to the release kinetics of cTnI,     a negative result within the first hours     of the onset of symptoms does not rule out     myocardial infarction with certainty.     If myocardial infarction is still suspected,     repeat the test at appropriate intervals.  PROTIME-INR     Status: None   Collection Time    03/22/14  9:55 AM      Result Value Ref Range   Prothrombin Time 13.1  11.6 - 15.2 seconds   INR 0.99  0.00 - 1.49  APTT     Status: None   Collection Time    03/22/14  9:55 AM      Result Value Ref Range   aPTT 29  24 -  37 seconds  MRSA PCR SCREENING     Status: None   Collection Time    03/22/14 12:13 PM      Result Value Ref Range   MRSA by PCR NEGATIVE  NEGATIVE   Comment:            The GeneXpert MRSA Assay (FDA     approved for NASAL specimens     only), is one component of a     comprehensive MRSA colonization     surveillance program. It is not     intended to diagnose MRSA     infection nor to guide or     monitor treatment for     MRSA infections.  TROPONIN I     Status: None   Collection Time    03/22/14  1:05 PM      Result Value Ref Range   Troponin I <0.30  <0.30 ng/mL   Comment:            Due to the release kinetics of cTnI,     a negative result within the first hours     of the onset of symptoms does not rule out     myocardial infarction with certainty.     If myocardial infarction is still suspected,     repeat the test at appropriate intervals.  COMPREHENSIVE METABOLIC PANEL     Status: Abnormal   Collection Time    03/22/14  1:05 PM      Result Value Ref Range   Sodium 140  137 - 147 mEq/L   Potassium 4.2  3.7 - 5.3 mEq/L   Chloride 100  96 - 112 mEq/L   CO2 24  19 - 32 mEq/L   Glucose, Bld 234 (*) 70 - 99 mg/dL   BUN 17  6 - 23 mg/dL   Creatinine, Ser 0.63  0.50 - 1.35 mg/dL   Calcium 9.1  8.4 - 10.5 mg/dL   Total Protein 7.0  6.0 - 8.3 g/dL   Albumin 3.9  3.5 - 5.2 g/dL   AST 35  0 - 37 U/L   ALT 37  0 - 53 U/L   Alkaline Phosphatase 69  39 - 117 U/L   Total Bilirubin 0.4  0.3 - 1.2 mg/dL   GFR calc non Af Amer >90  >90 mL/min   GFR calc Af Amer >90  >90 mL/min   Comment: (NOTE)     The eGFR has been calculated using the CKD EPI equation.     This calculation has not been validated in all clinical situations.     eGFR's persistently <90 mL/min signify possible Chronic Kidney     Disease.   Anion gap 16 (*) 5 - 15  CBC     Status: Abnormal   Collection Time    03/22/14  1:05 PM      Result Value Ref Range   WBC 13.9 (*) 4.0 - 10.5 K/uL   RBC 4.73  4.22  - 5.81 MIL/uL   Hemoglobin 14.2  13.0 - 17.0 g/dL   HCT 41.8  39.0 - 52.0 %   MCV 88.4  78.0 - 100.0 fL   MCH 30.0  26.0 - 34.0 pg   MCHC 34.0  30.0 - 36.0 g/dL   RDW 12.6  11.5 - 15.5 %   Platelets 173  150 - 400 K/uL  AMYLASE     Status: None   Collection Time    03/22/14  1:05 PM      Result Value Ref Range   Amylase 25  0 - 105 U/L  LIPASE, BLOOD     Status: None   Collection Time    03/22/14  1:05 PM      Result Value Ref Range   Lipase 17  11 - 59 U/L  GLUCOSE, CAPILLARY     Status: Abnormal   Collection Time    03/22/14  1:42 PM      Result Value Ref Range   Glucose-Capillary 233 (*) 70 - 99 mg/dL   Dg Chest Port 1 View  03/22/2014   CLINICAL DATA:  Chest pain  EXAM: PORTABLE CHEST - 1 VIEW  COMPARISON:  None.  FINDINGS: Cardiac shadow is mildly enlarged. Mild vascular congestion is seen. No focal infiltrate or sizable effusion is noted.  IMPRESSION: Mild CHF.   Electronically Signed   By: Inez Catalina M.D.   On: 03/22/2014 10:09   CLINICAL DATA: Abdominal pain.  EXAM:  ULTRASOUND ABDOMEN COMPLETE  COMPARISON: CT abdomen and pelvis 12/13/2012.  FINDINGS:  Gallbladder:  A few small stones are seen in the gallbladder measuring up to 0.8  cm. There is also some sludge present. No gallbladder wall  thickening or pericholecystic fluid. Sonographer reports negative  Murphy's sign.  Common bile duct:  Diameter: 0.4 cm.  Liver:  Demonstrates coarsened and increased echogenicity consistent with  fatty infiltration. No focal lesion or intrahepatic biliary ductal  dilatation.  IVC:  No abnormality visualized.  Pancreas:  Visualized portion unremarkable.  Spleen:  Size and appearance within normal limits.  Right Kidney:  Length: 10.5 cm. Echogenicity within normal limits. No mass or No  hydronephrosis visualized.  Left Kidney:  Length: 13.0 cm. Echogenicity within normal limits. 1.1 cm simple  cyst off the lower pole noted. No hydronephrosis  visualized.  Abdominal  aorta:  No aneurysm visualized.  Other findings:  None.  IMPRESSION:  Small gallstones and a small amount of gallbladder sludge without  evidence of cholecystitis.  Fatty infiltration of the liver.  Electronically Signed  By: Inge Rise M.D.  On: 03/22/2014 16:21            Review of Systems  Constitutional: Negative.   HENT: Negative.   Eyes: Negative.   Respiratory: Negative.   Cardiovascular: Positive for chest pain.  Gastrointestinal: Positive for abdominal pain. Negative for heartburn, nausea, vomiting, diarrhea, constipation, blood in stool and melena.  Musculoskeletal: Negative.   Neurological: Negative.   Endo/Heme/Allergies: Negative.   Psychiatric/Behavioral: Negative.    Blood pressure 123/63, pulse 64, resp. rate 21, height 5' 8"  (1.727 m), weight 86.7 kg (191 lb 2.2 oz), SpO2 94.00%. Physical Exam  Constitutional: He is oriented to person, place, and time. He appears well-developed and well-nourished.  HENT:  Head: Normocephalic and atraumatic.  Eyes: Conjunctivae and EOM are normal. Pupils are equal, round, and reactive to light.  Neck: Normal range of motion. Neck supple.  Cardiovascular: Normal rate and regular rhythm.   Respiratory: Effort normal and breath sounds normal.  GI: Soft. Normal appearance and bowel sounds are normal. He exhibits no mass. There is no hepatosplenomegaly. There is tenderness in the right upper quadrant, epigastric area and periumbilical area. There is guarding. There is no rebound and no CVA tenderness.  Musculoskeletal: Normal range of motion.  Neurological: He is alert and oriented to person, place, and time.  Skin: Skin is warm and dry.  Psychiatric: He has a normal mood and affect. His behavior is normal. Judgment and thought content normal.   Assessment/Plan: 1) Abdominal pain-RUQ/Epigastric and periumbilical radiating to the chest: probably secondary to cholelithiasis and gallbladder sludge noted on an ultrasound done  earlier today. He can have a surgical evaluation as an OP of he so desires. An EGD can also be done to rule out PUD. I will discuss this with Dr. Burt Knack to decide the timing of the procedure. 2) Fatty liver also noted on ultrasound.  3) AODM.  Rajni Holsworth 03/22/2014, 3:45 PM

## 2014-03-22 NOTE — ED Provider Notes (Signed)
CSN: [id number]     Arrival date & time 03/22/14  1448 History   First MD Initiated Contact with Patient 03/22/14 0935     Chief Complaint  Patient presents with  . Chest Pain      HPI  Patient presents with substernal chest pain. Normal day yesterday. Supple during the night. He waking approximately 7 AM. Pain described as a pressure in his low anterior chest. Felt nausea but no vomiting. Denies shortness of breath. Wife states that he was "soaked". No past similar episodes history of heart disease. An episode of pancreatitis 5 years ago. He does not recall why. States this does not feel similar. No back pain no shoulder pain no jaw pain.  Past Medical History  Diagnosis Date  . Diabetes mellitus without complication    Past Surgical History  Procedure Laterality Date  . Appendectomy  remote  . Esophagogastroduodenoscopy N/A 03/23/2014    Procedure: ESOPHAGOGASTRODUODENOSCOPY (EGD);  Surgeon: Juanita Craver, MD;  Location: [facility] ENDOSCOPY;  Service: Endoscopy;  Laterality: N/A;  . Cholecystectomy N/A 03/25/2014    Procedure: LAPAROSCOPIC CHOLECYSTECTOMY;  Surgeon: Harl Bowie, MD;  Location: [facility];  Service: General;  Laterality: N/A;   History reviewed. No pertinent family history. History  Substance Use Topics  . Smoking status: Former Research scientist (life sciences)  . Smokeless tobacco: Not on file  . Alcohol Use: No    Review of Systems  Constitutional: Positive for diaphoresis. Negative for fever, chills, appetite change and fatigue.  HENT: Negative for mouth sores, sore throat and trouble swallowing.   Eyes: Negative for visual disturbance.  Respiratory: Negative for cough, chest tightness, shortness of breath and wheezing.   Cardiovascular: Positive for chest pain.  Gastrointestinal: Positive for nausea. Negative for vomiting, abdominal pain, diarrhea and abdominal distention.  Endocrine: Negative for polydipsia, polyphagia and polyuria.  Genitourinary: Negative for dysuria, frequency and  hematuria.  Musculoskeletal: Negative for gait problem.  Skin: Negative for color change, pallor and rash.  Neurological: Negative for dizziness, syncope, light-headedness and headaches.  Hematological: Does not bruise/bleed easily.  Psychiatric/Behavioral: Negative for behavioral problems and confusion.      Allergies  Tylenol  Home Medications   Prior to Admission medications   Medication Sig Start Date End Date Taking? Authorizing Provider  Linagliptin-Metformin HCl (JENTADUETO) 2.01-999 MG TABS Take 1 tablet by mouth 2 (two) times daily with a meal.   Yes Historical Provider, MD  oxyCODONE (OXY IR/ROXICODONE) 5 MG immediate release tablet Take 1 tablet (5 mg total) by mouth every 6 (six) hours as needed for severe pain. 03/26/14   Velvet Bathe, MD  pantoprazole (PROTONIX) 40 MG tablet Take 1 tablet (40 mg total) by mouth daily. 03/26/14   Velvet Bathe, MD   BP 108/57  Pulse 66  Temp(Src) 99 F (37.2 C) (Oral)  Resp 18  Ht 5\' 8"  (1.727 m)  Wt 187 lb 6.3 oz (85 kg)  BMI 28.50 kg/m2  SpO2 93% Physical Exam  Constitutional: He is oriented to person, place, and time. He appears well-developed and well-nourished. No distress.  HENT:  Head: Normocephalic.  Eyes: Conjunctivae are normal. Pupils are equal, round, and reactive to light. No scleral icterus.  Neck: Normal range of motion. Neck supple. No thyromegaly present.  Cardiovascular: Regular rhythm.  Bradycardia present.  Exam reveals no gallop and no friction rub.   No murmur heard. Sinus rhythm. Her rate 49.  Pulmonary/Chest: Effort normal and breath sounds normal. No respiratory distress. He has no wheezes. He has no rales.  Clear bilateral breath sounds  Abdominal: Soft. Bowel sounds are normal. He exhibits no distension. There is no tenderness. There is no rebound.  Musculoskeletal: Normal range of motion.  Neurological: He is alert and oriented to person, place, and time.  Skin: Skin is warm. No rash noted. He is  diaphoretic.  Psychiatric: He has a normal mood and affect. His behavior is normal.    ED Course  CRITICAL CARE Performed by: Oslo Huntsman, [place] by: Tanna Furry Total critical care time: 30 minutes Critical care start time: 03/22/2014 9:32 AM Critical care end time: 03/22/2014 10:03 AM Critical care time was exclusive of separately billable procedures and treating other patients. Critical care was necessary to treat or prevent imminent or life-threatening deterioration of the following conditions: STEMI. Critical care was time spent personally by me on the following activities: blood draw for specimens, development of treatment plan with patient or surrogate, discussions with consultants, interpretation of cardiac output measurements, evaluation of patient's response to treatment, examination of patient, obtaining history from patient or surrogate, ordering and review of radiographic studies and pulse oximetry.   (including critical care time) Labs Review Labs Reviewed  CBC - Abnormal; Notable for the following:    WBC 13.4 (*)    All other components within normal limits  BASIC METABOLIC PANEL - Abnormal; Notable for the following:    Glucose, Bld 286 (*)    All other components within normal limits  COMPREHENSIVE METABOLIC PANEL - Abnormal; Notable for the following:    Glucose, Bld 234 (*)    Anion gap 16 (*)    All other components within normal limits  CBC - Abnormal; Notable for the following:    WBC 13.9 (*)    All other components within normal limits  HEMOGLOBIN A1C - Abnormal; Notable for the following:    Hemoglobin A1C 7.6 (*)    Mean Plasma Glucose 171 (*)    All other components within normal limits  GLUCOSE, CAPILLARY - Abnormal; Notable for the following:    Glucose-Capillary 233 (*)    All other components within normal limits  CBC - Abnormal; Notable for the following:    WBC 12.0 (*)    Hemoglobin 12.8 (*)    HCT 38.9 (*)    All other components within  normal limits  BASIC METABOLIC PANEL - Abnormal; Notable for the following:    Glucose, Bld 154 (*)    Calcium 8.2 (*)    All other components within normal limits  GLUCOSE, CAPILLARY - Abnormal; Notable for the following:    Glucose-Capillary 194 (*)    All other components within normal limits  GLUCOSE, CAPILLARY - Abnormal; Notable for the following:    Glucose-Capillary 136 (*)    All other components within normal limits  GLUCOSE, CAPILLARY - Abnormal; Notable for the following:    Glucose-Capillary 155 (*)    All other components within normal limits  CBC - Abnormal; Notable for the following:    WBC 12.8 (*)    All other components within normal limits  HEMOGLOBIN A1C - Abnormal; Notable for the following:    Hemoglobin A1C 7.5 (*)    Mean Plasma Glucose 169 (*)    All other components within normal limits  GLUCOSE, CAPILLARY - Abnormal; Notable for the following:    Glucose-Capillary 213 (*)    All other components within normal limits  URINALYSIS, ROUTINE W REFLEX MICROSCOPIC - Abnormal; Notable for the following:    Color, Urine AMBER (*)  Glucose, UA >1000 (*)    Bilirubin Urine SMALL (*)    Ketones, ur 15 (*)    Protein, ur 30 (*)    Urobilinogen, UA 4.0 (*)    All other components within normal limits  GLUCOSE, CAPILLARY - Abnormal; Notable for the following:    Glucose-Capillary 226 (*)    All other components within normal limits  GLUCOSE, CAPILLARY - Abnormal; Notable for the following:    Glucose-Capillary 163 (*)    All other components within normal limits  GLUCOSE, CAPILLARY - Abnormal; Notable for the following:    Glucose-Capillary 150 (*)    All other components within normal limits  HEPATIC FUNCTION PANEL - Abnormal; Notable for the following:    Albumin 2.7 (*)    AST 49 (*)    ALT 108 (*)    Total Bilirubin 2.1 (*)    Bilirubin, Direct 1.1 (*)    Indirect Bilirubin 1.0 (*)    All other components within normal limits  GLUCOSE, CAPILLARY -  Abnormal; Notable for the following:    Glucose-Capillary 191 (*)    All other components within normal limits  GLUCOSE, CAPILLARY - Abnormal; Notable for the following:    Glucose-Capillary 228 (*)    All other components within normal limits  GLUCOSE, CAPILLARY - Abnormal; Notable for the following:    Glucose-Capillary 166 (*)    All other components within normal limits  GLUCOSE, CAPILLARY - Abnormal; Notable for the following:    Glucose-Capillary 141 (*)    All other components within normal limits  GLUCOSE, CAPILLARY - Abnormal; Notable for the following:    Glucose-Capillary 151 (*)    All other components within normal limits  GLUCOSE, CAPILLARY - Abnormal; Notable for the following:    Glucose-Capillary 169 (*)    All other components within normal limits  GLUCOSE, CAPILLARY - Abnormal; Notable for the following:    Glucose-Capillary 173 (*)    All other components within normal limits  HEPATIC FUNCTION PANEL - Abnormal; Notable for the following:    Albumin 2.4 (*)    AST 65 (*)    ALT 100 (*)    Total Bilirubin 1.8 (*)    Bilirubin, Direct 0.9 (*)    All other components within normal limits  CBC - Abnormal; Notable for the following:    RBC 4.06 (*)    Hemoglobin 12.2 (*)    HCT 36.7 (*)    Platelets 144 (*)    All other components within normal limits  GLUCOSE, CAPILLARY - Abnormal; Notable for the following:    Glucose-Capillary 221 (*)    All other components within normal limits  GLUCOSE, CAPILLARY - Abnormal; Notable for the following:    Glucose-Capillary 159 (*)    All other components within normal limits  GLUCOSE, CAPILLARY - Abnormal; Notable for the following:    Glucose-Capillary 139 (*)    All other components within normal limits  MRSA PCR SCREENING  CULTURE, BLOOD (ROUTINE X 2)  CULTURE, BLOOD (ROUTINE X 2)  URINE CULTURE  TROPONIN I  PROTIME-INR  APTT  TROPONIN I  TROPONIN I  TROPONIN I  AMYLASE  LIPASE, BLOOD  URINE MICROSCOPIC-ADD  ON  I-STAT TROPOININ, ED  SURGICAL PATHOLOGY    Imaging Review No results found.   EKG Interpretation   Date/Time:  Saturday March 22 2014 09:32:13 EDT Ventricular Rate:  49 PR Interval:  144 QRS Duration: 98 QT Interval:  450 QTC Calculation: 406 R Axis:  104 Text Interpretation:  Sinus bradycardia Right axis deviation Abnormal  R-wave progression, late transition Minimal ST elevation, inferior leads  Confirmed by Jeneen Rinks  MD, [place] ([id number]) on 03/22/2014 9:41:19 AM      MDM   Final diagnoses:  ST elevation myocardial infarction involving right coronary artery   Patient with chest pain. History of non-insulin-dependent diabetes. His pain, nausea, bradycardia, diaphoresis. Code STEMI was called. Patient given heparin bolus. He is given aspirin by mouth.  Given Morphine 4mg  ivp. Zofran 4mg  IVP.  After the code STEMI was called Dr. Burt Knack as always return my call immediately. We discussed the patient and his EKG. He agrees with emergent transfer to [facility] for angiogram.  Tanna Furry, MD 03/31/14 [phone]

## 2014-03-22 NOTE — ED Notes (Signed)
As I write this, pt. Has just left with [facility] without incident.

## 2014-03-22 NOTE — ED Notes (Signed)
Pt states he woke up about 5 am w/ chest epigastric pain radiating to lt chest.  Pt clutching chest on arrival.  Pt pale and diaphoretic.  Unable to describe pain as english is not pt's first language.

## 2014-03-23 ENCOUNTER — Encounter (HOSPITAL_COMMUNITY): Payer: Self-pay | Admitting: *Deleted

## 2014-03-23 ENCOUNTER — Encounter (HOSPITAL_COMMUNITY)
Admission: EM | Disposition: A | Discharge status: Home / Self Care | Payer: Self-pay | Source: Home / Self Care | Attending: Family Medicine

## 2014-03-23 ENCOUNTER — Inpatient Hospital Stay (HOSPITAL_COMMUNITY): Payer: 59

## 2014-03-23 DIAGNOSIS — R079 Chest pain, unspecified: Secondary | ICD-10-CM | POA: Diagnosis not present

## 2014-03-23 DIAGNOSIS — K4090 Unilateral inguinal hernia, without obstruction or gangrene, not specified as recurrent: Secondary | ICD-10-CM | POA: Diagnosis not present

## 2014-03-23 DIAGNOSIS — E119 Type 2 diabetes mellitus without complications: Secondary | ICD-10-CM

## 2014-03-23 DIAGNOSIS — K8000 Calculus of gallbladder with acute cholecystitis without obstruction: Secondary | ICD-10-CM | POA: Diagnosis not present

## 2014-03-23 DIAGNOSIS — R509 Fever, unspecified: Secondary | ICD-10-CM | POA: Diagnosis present

## 2014-03-23 DIAGNOSIS — K7689 Other specified diseases of liver: Secondary | ICD-10-CM | POA: Diagnosis not present

## 2014-03-23 DIAGNOSIS — D72829 Elevated white blood cell count, unspecified: Secondary | ICD-10-CM | POA: Diagnosis present

## 2014-03-23 HISTORY — PX: ESOPHAGOGASTRODUODENOSCOPY: SHX5428

## 2014-03-23 LAB — GLUCOSE, CAPILLARY
Glucose-Capillary: 155 mg/dL — ABNORMAL HIGH (ref 70–99)
Glucose-Capillary: 213 mg/dL — ABNORMAL HIGH (ref 70–99)
Glucose-Capillary: 226 mg/dL — ABNORMAL HIGH (ref 70–99)

## 2014-03-23 LAB — CBC
HCT: 38.9 % — ABNORMAL LOW (ref 39.0–52.0)
HCT: 44.3 % (ref 39.0–52.0)
Hemoglobin: 12.8 g/dL — ABNORMAL LOW (ref 13.0–17.0)
Hemoglobin: 14.7 g/dL (ref 13.0–17.0)
MCH: 29.1 pg (ref 26.0–34.0)
MCH: 30.4 pg (ref 26.0–34.0)
MCHC: 32.9 g/dL (ref 30.0–36.0)
MCHC: 33.2 g/dL (ref 30.0–36.0)
MCV: 88.4 fL (ref 78.0–100.0)
MCV: 91.7 fL (ref 78.0–100.0)
PLATELETS: 155 10*3/uL (ref 150–400)
PLATELETS: 154 10*3/uL (ref 150–400)
RBC: 4.4 MIL/uL (ref 4.22–5.81)
RBC: 4.83 MIL/uL (ref 4.22–5.81)
RDW: 13 % (ref 11.5–15.5)
RDW: 13.1 % (ref 11.5–15.5)
WBC: 12 10*3/uL — ABNORMAL HIGH (ref 4.0–10.5)
WBC: 12.8 10*3/uL — ABNORMAL HIGH (ref 4.0–10.5)

## 2014-03-23 LAB — BASIC METABOLIC PANEL
ANION GAP: 15 (ref 5–15)
BUN: 18 mg/dL (ref 6–23)
CHLORIDE: 99 meq/L (ref 96–112)
CO2: 24 mEq/L (ref 19–32)
CREATININE: 0.8 mg/dL (ref 0.50–1.35)
Calcium: 8.2 mg/dL — ABNORMAL LOW (ref 8.4–10.5)
GFR calc non Af Amer: >90 mL/min (ref >90)
Glucose, Bld: 154 mg/dL — ABNORMAL HIGH (ref 70–99)
Potassium: 3.7 mEq/L (ref 3.7–5.3)
SODIUM: 138 meq/L (ref 137–147)

## 2014-03-23 LAB — HEMOGLOBIN A1C
Hgb A1c MFr Bld: 7.5 % — ABNORMAL HIGH (ref <5.7)
Mean Plasma Glucose: 169 mg/dL — ABNORMAL HIGH (ref <117)

## 2014-03-23 SURGERY — EGD (ESOPHAGOGASTRODUODENOSCOPY)
Anesthesia: Moderate Sedation

## 2014-03-23 MED ORDER — MIDAZOLAM HCL 5 MG/ML IJ SOLN
INTRAMUSCULAR | Status: AC
  Filled 2014-03-23: qty 2

## 2014-03-23 MED ORDER — MIDAZOLAM HCL 10 MG/2ML IJ SOLN
INTRAMUSCULAR | Status: DC | PRN
  Administered 2014-03-23 (×2): 2 mg via INTRAVENOUS

## 2014-03-23 MED ORDER — FENTANYL CITRATE 0.05 MG/ML IJ SOLN
INTRAMUSCULAR | Status: AC
  Filled 2014-03-23: qty 2

## 2014-03-23 MED ORDER — FENTANYL CITRATE 0.05 MG/ML IJ SOLN
INTRAMUSCULAR | Status: DC | PRN
  Administered 2014-03-23 (×2): 25 ug via INTRAVENOUS

## 2014-03-23 MED ORDER — IOHEXOL 300 MG/ML  SOLN
25.0000 mL | INTRAMUSCULAR | Status: AC
  Administered 2014-03-23 (×2): 25 mL via ORAL

## 2014-03-23 MED ORDER — SODIUM CHLORIDE 0.9 % IJ SOLN
INTRAMUSCULAR | Status: AC
Start: 2014-03-23 — End: 2014-03-24
  Filled 2014-03-23: qty 50

## 2014-03-23 MED ORDER — LIDOCAINE VISCOUS 2 % MT SOLN
OROMUCOSAL | Status: AC
  Filled 2014-03-23: qty 15

## 2014-03-23 MED ORDER — SODIUM CHLORIDE 0.9 % IV SOLN: INTRAVENOUS | Status: DC

## 2014-03-23 MED ORDER — PIPERACILLIN-TAZOBACTAM 3.375 G IVPB
3.3750 g | Freq: Three times a day (TID) | INTRAVENOUS | Status: DC
  Administered 2014-03-23 – 2014-03-26 (×8): 3.375 g via INTRAVENOUS
  Filled 2014-03-23 (×11): qty 50

## 2014-03-23 MED ORDER — IOHEXOL 300 MG/ML  SOLN
100.0000 mL | Freq: Once | INTRAMUSCULAR | Status: AC | PRN
  Administered 2014-03-23: 100 mL via INTRAVENOUS

## 2014-03-23 MED ORDER — PANTOPRAZOLE SODIUM 40 MG PO TBEC
40.0000 mg | DELAYED_RELEASE_TABLET | Freq: Every day | ORAL | Status: DC
  Administered 2014-03-23 – 2014-03-26 (×3): 40 mg via ORAL
  Filled 2014-03-23 (×3): qty 1

## 2014-03-23 MED ORDER — DIPHENHYDRAMINE HCL 50 MG/ML IJ SOLN
INTRAMUSCULAR | Status: AC
  Filled 2014-03-23: qty 1

## 2014-03-23 NOTE — Progress Notes (Signed)
Chaplain responded to Code STEMI page for pt coming from [facility]. Could not locate any family.

## 2014-03-23 NOTE — Progress Notes (Signed)
[facility] PROGRESS NOTE  Christopher Arnold WPY:[id number] DOB: 1945/11/18 DOA: 03/22/2014 PCP: No primary provider on file.  Assessment/Plan: 1. Right upper quadrant abdominal pain, fever, leukocytosis and abnormal US showing gall bladder sludge: - he is currently in step down .  - CT abd and pelvis ordered to further evaluate cholecystitis.  - empiricallys tart him on IV zosyn.  - blood cultures ordered and pending.    2. Mild duodenitis: On PPI.   3. CHEST PAIN: S/p cath with no CAD, and normal LV function. Cardiology has signed off.    4. Diabetes Mellitus:  hgba1c is 7.4 SSI.   dvt prophylaxis.   Code Status: full code.  Family Communication: discussed in detail with wife at bedside Disposition Plan: transfer to telemetry.    Consultants:  GI    Procedures:  US abdomen  Antibiotics:  none  HPI/Subjective: Intermittent abdominal and chest pain. Currently pain free.   Objective: Filed Vitals:   03/23/14 1400  BP:   Pulse: 55  Temp:   Resp:     Intake/Output Summary (Last 24 hours) at 03/23/14 1608 Last data filed at 03/23/14 1239  Gross per 24 hour  Intake   1720 ml  Output    240 ml  Net   1480 ml   Filed Weights   03/22/14 0933 03/22/14 1230 03/22/14 1300  Weight: 80 kg (176 lb 5.9 oz) 86.7 kg (191 lb 2.2 oz) 86.7 kg (191 lb 2.2 oz)    Exam:   General:  Alert afebrile comfortable  Cardiovascular: s1s2  Respiratory: ctab  Abdomen: soft NT ND BS+  Musculoskeletal: no pedal edema.   Data Reviewed: Basic Metabolic Panel:  Recent Labs Lab 03/22/14 0942 03/22/14 1305 03/23/14 0510  NA 137 140 138  K 4.1 4.2 3.7  CL 99 100 99  CO2 23 24 24   GLUCOSE 286* 234* 154*  BUN 20 17 18   CREATININE 0.77 0.63 0.80  CALCIUM 9.8 9.1 8.2*   Liver Function Tests:  Recent Labs Lab 03/22/14 1305  AST 35  ALT 37  ALKPHOS 69  BILITOT 0.4  PROT 7.0  ALBUMIN 3.9    Recent Labs Lab 03/22/14 1305  LIPASE 17  AMYLASE 25    No results found for this basename: AMMONIA,  in the last 168 hours CBC:  Recent Labs Lab 03/22/14 0942 03/22/14 1305 03/23/14 0510  WBC 13.4* 13.9* 12.0*  HGB 14.8 14.2 12.8*  HCT 43.4 41.8 38.9*  MCV 87.9 88.4 88.4  PLT 175 173 155   Cardiac Enzymes:  Recent Labs Lab 03/22/14 0955 03/22/14 1305 03/22/14 1900 03/22/14 2245  TROPONINI <0.30 <0.30 <0.30 <0.30   BNP (last 3 results) No results found for this basename: PROBNP,  in the last 8760 hours CBG:  Recent Labs Lab 03/22/14 1342 03/22/14 1536 03/22/14 2025 03/23/14 0824  GLUCAP 233* 194* 136* 155*    Recent Results (from the past 240 hour(s))  MRSA PCR SCREENING     Status: None   Collection Time    03/22/14 12:13 PM      Result Value Ref Range Status   MRSA by PCR NEGATIVE  NEGATIVE Final   Comment:            The GeneXpert MRSA Assay (FDA     approved for NASAL specimens     only), is one component of a     comprehensive MRSA colonization     surveillance program. It is not     intended to  diagnose MRSA     infection nor to guide or     monitor treatment for     MRSA infections.     Studies: US Abdomen Complete  03/22/2014   CLINICAL DATA:  Abdominal pain.  EXAM: ULTRASOUND ABDOMEN COMPLETE  COMPARISON:  CT abdomen and pelvis 12/13/2012.  FINDINGS: Gallbladder:  A few small stones are seen in the gallbladder measuring up to 0.8 cm. There is also some sludge present. No gallbladder wall thickening or pericholecystic fluid. Sonographer reports negative Murphy's sign.  Common bile duct:  Diameter: 0.4 cm.  Liver:  Demonstrates coarsened and increased echogenicity consistent with fatty infiltration. No focal lesion or intrahepatic biliary ductal dilatation.  IVC:  No abnormality visualized.  Pancreas:  Visualized portion unremarkable.  Spleen:  Size and appearance within normal limits.  Right Kidney:  Length: 10.5 cm. Echogenicity within normal limits. No mass or No hydronephrosis visualized.  Left  Kidney:  Length: 13.0 cm. Echogenicity within normal limits. 1.1 cm simple cyst off the lower pole noted. No hydronephrosis visualized.  Abdominal aorta:  No aneurysm visualized.  Other findings:  None.  IMPRESSION: Small gallstones and a small amount of gallbladder sludge without evidence of cholecystitis.  Fatty infiltration of the liver.   Electronically Signed   By: Inge Rise M.D.   On: 03/22/2014 16:21   Dg Chest Port 1 View  03/22/2014   CLINICAL DATA:  Chest pain  EXAM: PORTABLE CHEST - 1 VIEW  COMPARISON:  None.  FINDINGS: Cardiac shadow is mildly enlarged. Mild vascular congestion is seen. No focal infiltrate or sizable effusion is noted.  IMPRESSION: Mild CHF.   Electronically Signed   By: Inez Catalina M.D.   On: 03/22/2014 10:09    Scheduled Meds: . heparin  5,000 Units Subcutaneous 3 times per day  . insulin aspart  0-15 Units Subcutaneous TID WC  . iohexol  25 mL Oral Q1 Hr x 2  . pantoprazole  40 mg Oral Daily   Continuous Infusions: . sodium chloride Stopped (03/23/14 1239)  . sodium chloride      Active Problems:   Anginal chest pain at rest   Type 2 diabetes mellitus   Chest pain at rest    Time spent: 30 minutes    [facility] Pager [phone]. If 7PM-7AM, please contact night-coverage at [email or web address], password [facility] 03/23/2014, 4:08 PM  LOS: 1 day

## 2014-03-23 NOTE — Op Note (Signed)
[facility] [place], [address]   OPERATIVE PROCEDURE REPORT  PATIENT :Christopher, Arnold  MR#: [id number] BIRTHDATE :[birth year] GENDER: Male ENDOSCOPIST:  Edmonia James, MD ASSISTANT:   Estelle June, RN Corliss Parish, technician PROCEDURE DATE: 2014-03-25 PRE-PROCEDURE PREPERATION: Patient fasted for 4 hours prior to procedure. PRE-PROCEDURE PHYSICAL: Patient has stable vital signs. Neck is supple. There is no JVD, thyromegaly or LAD.  Chest clear to auscultation.  S1 and S2 regular.  Abdomen soft, non-distended, non-tender with NABS. PROCEDURE:     EGD, diagnostic ASA CLASS:     Class II INDICATIONS:     Epigastric pain and chest pain. MEDICATIONS:     Fentanyl 50 mcg  and Versed 4 mg IV TOPICAL ANESTHETIC:   Viscous xylocaine-10 cc PO.  DESCRIPTION OF PROCEDURE:   After the risks benefits and alternatives of the procedure were thoroughly explained, informed consent was obtained.  The Pentax Gastroscope [id number]  was introduced through the mouth and advanced to the second portion of the duodenum , without limitations. The instrument was slowly withdrawn as the mucosa was fully examined.   Except for mild duodenitis in the duodenal bulb, the esophagus, stomach and the proximal small bowel appeared normal. There were no ulcers, erosions, masses or polyps noted. Retroflexed views revealed no abnormalities. The scope was then withdrawn from the patient and the procedure terminated. The patient tolerated the procedure without immediate complications.  IMPRESSION:  Mild duodenitis in the duodenal bulb; otherwise, normal esophagogastroduodenoscopy.  RECOMMENDATIONS:     1.  Anti-reflux regimen to be followed. 2.  Continue current medications. 3. Stay on PPI's Protonix 40 mg PO QAM. 4. Surgical evaluation for cholelithiasis.  REPEAT EXAM:  No recall planned for now.  DISCHARGE INSTRUCTIONS: standard discharge instructions  given.  _______________________________ eSigned:  Dr. Edmonia James, MD Mar 25, 2014 12:46 PM   CPT CODES:     53614, EGD  DIAGNOSIS CODES:     789.06, Epigastric pain 786.50 Chest pain   CC: Dr. Talbot Grumbling.  PATIENT NAME:  Christopher, Arnold MR#: [id number]

## 2014-03-23 NOTE — Progress Notes (Addendum)
Patient ID: Christopher Arnold, male   DOB: 1945/10/03, 68 y.o.   MRN: [id number]    Subjective:  Patient not seen on rounds this am  Had endoscopy with Dr Collene Mares that showed mild duodenitis.  Still with crampy epigastric pain.  Pain to palpation chest   Objective:  Filed Vitals:   03/23/14 1230 03/23/14 1235 03/23/14 1240 03/23/14 1400  BP: 87/44 103/44 102/42   Pulse: 61 66 64 55  Temp:      TempSrc:      Resp: 20 25 6    Height:      Weight:      SpO2: 97% 97% 97% 96%    Intake/Output from previous day:  Intake/Output Summary (Last 24 hours) at 03/23/14 1417 Last data filed at 03/23/14 1239  Gross per 24 hour  Intake   1880 ml  Output    240 ml  Net   1640 ml    Physical Exam: Affect appropriate Healthy [place] male  HEENT: normal Neck supple with no adenopathy JVP normal no bruits no thyromegaly Lungs clear with no wheezing and good diaphragmatic motion Heart:  S1/S2 no murmur, no rub, gallop or click PMI normal Abdomen: mild pain to palpation epigastric area no rebound BS positve, no tenderness, no AAA no bruit.  No HSM or HJR Distal pulses intact with no bruits No edema Neuro non-focal Skin warm and dry No muscular weakness   Lab Results: Basic Metabolic Panel:  Recent Labs  03/22/14 1305 03/23/14 0510  NA 140 138  K 4.2 3.7  CL 100 99  CO2 24 24  GLUCOSE 234* 154*  BUN 17 18  CREATININE 0.63 0.80  CALCIUM 9.1 8.2*   Liver Function Tests:  Recent Labs  03/22/14 1305  AST 35  ALT 37  ALKPHOS 69  BILITOT 0.4  PROT 7.0  ALBUMIN 3.9    Recent Labs  03/22/14 1305  LIPASE 17  AMYLASE 25   CBC:  Recent Labs  03/22/14 1305 03/23/14 0510  WBC 13.9* 12.0*  HGB 14.2 12.8*  HCT 41.8 38.9*  MCV 88.4 88.4  PLT 173 155   Cardiac Enzymes:  Recent Labs  03/22/14 1305 03/22/14 1900 03/22/14 2245  TROPONINI <0.30 <0.30 <0.30   Hemoglobin A1C:  Recent Labs  03/22/14 1305  HGBA1C 7.6*    Imaging: US Abdomen  Complete  03/22/2014   CLINICAL DATA:  Abdominal pain.  EXAM: ULTRASOUND ABDOMEN COMPLETE  COMPARISON:  CT abdomen and pelvis 12/13/2012.  FINDINGS: Gallbladder:  A few small stones are seen in the gallbladder measuring up to 0.8 cm. There is also some sludge present. No gallbladder wall thickening or pericholecystic fluid. Sonographer reports negative Murphy's sign.  Common bile duct:  Diameter: 0.4 cm.  Liver:  Demonstrates coarsened and increased echogenicity consistent with fatty infiltration. No focal lesion or intrahepatic biliary ductal dilatation.  IVC:  No abnormality visualized.  Pancreas:  Visualized portion unremarkable.  Spleen:  Size and appearance within normal limits.  Right Kidney:  Length: 10.5 cm. Echogenicity within normal limits. No mass or No hydronephrosis visualized.  Left Kidney:  Length: 13.0 cm. Echogenicity within normal limits. 1.1 cm simple cyst off the lower pole noted. No hydronephrosis visualized.  Abdominal aorta:  No aneurysm visualized.  Other findings:  None.  IMPRESSION: Small gallstones and a small amount of gallbladder sludge without evidence of cholecystitis.  Fatty infiltration of the liver.   Electronically Signed   By: Inge Rise M.D.   On: 03/22/2014 16:21  Dg Chest Port 1 View  03/22/2014   CLINICAL DATA:  Chest pain  EXAM: PORTABLE CHEST - 1 VIEW  COMPARISON:  None.  FINDINGS: Cardiac shadow is mildly enlarged. Mild vascular congestion is seen. No focal infiltrate or sizable effusion is noted.  IMPRESSION: Mild CHF.   Electronically Signed   By: Inez Catalina M.D.   On: 03/22/2014 10:09    Cardiac Studies:  ECG:  SR poor R wave progression    Telemetry:  NSR no arrhythmia   Echo:   Medications:   . heparin  5,000 Units Subcutaneous 3 times per day  . insulin aspart  0-15 Units Subcutaneous TID WC     . sodium chloride Stopped (03/23/14 1239)  . sodium chloride      Assessment/Plan:  Chest Pain  Cath with no CAD normal aorta and Normal LV  function  No further w/u Abdomen:  Still with epigastric pain , mild elevation in WBC  PPI per GI  Will transfer to medical service [place] negative for acute cholycystitis Don't feel comfortable sending him home with continued pain and elevated WBC  Will do CT abdomen  2014 one was done and ok    Will write for Protonix 40 mg daily  DM:  Check A1c  Only written for SS  Resume home meds per IM after contrast  For CT   Discussed with wife and son   Jenkins Rouge 03/23/2014, 2:17 PM

## 2014-03-23 NOTE — Progress Notes (Signed)
ANTIBIOTIC CONSULT NOTE - INITIAL  Pharmacy Consult for Zosyn Indication: possible cholecystitis  No Known Allergies  Patient Measurements: Height: 5\' 8"  (172.7 cm) Weight: 191 lb 2.2 oz (86.7 kg) IBW/kg (Calculated) : 68.4  Vital Signs: Temp: 100.9 F (38.3 C) (07/12 1654) Temp src: Oral (07/12 1654) BP: 108/63 mmHg (07/12 1654) Pulse Rate: 55 (07/12 1400) Intake/Output from previous day: 07/11 0701 - 07/12 0700 In: 1561.3 [P.O.:240; I.V.:1321.3] Out: 240 [Urine:240] Intake/Output from this shift: Total I/O In: 1200 [P.O.:760; I.V.:440] Out: -   Labs:  Recent Labs  03/22/14 0942 03/22/14 1305 03/23/14 0510 03/23/14 1544  WBC 13.4* 13.9* 12.0* 12.8*  HGB 14.8 14.2 12.8* 14.7  PLT 175 173 155 154  CREATININE 0.77 0.63 0.80  --    Estimated Creatinine Clearance: 97.3 ml/min (by C-G formula based on Cr of 0.8). No results found for this basename: VANCOTROUGH, Corlis Leak, VANCORANDOM, Hadar, GENTPEAK, GENTRANDOM, TOBRATROUGH, TOBRAPEAK, TOBRARND, AMIKACINPEAK, AMIKACINTROU, AMIKACIN,  in the last 72 hours   Microbiology: Recent Results (from the past 720 hour(s))  MRSA PCR SCREENING     Status: None   Collection Time    03/22/14 12:13 PM      Result Value Ref Range Status   MRSA by PCR NEGATIVE  NEGATIVE Final   Comment:            The GeneXpert MRSA Assay (FDA     approved for NASAL specimens     only), is one component of a     comprehensive MRSA colonization     surveillance program. It is not     intended to diagnose MRSA     infection nor to guide or     monitor treatment for     MRSA infections.    Medical History: Past Medical History  Diagnosis Date  . Diabetes mellitus without complication     Medications:  Prescriptions prior to admission  Medication Sig Dispense Refill  . Linagliptin-Metformin HCl (JENTADUETO) 2.01-999 MG TABS Take 1 tablet by mouth 2 (two) times daily with a meal.      . naproxen (NAPROSYN) 500 MG tablet Take 500 mg  by mouth 2 (two) times daily with a meal.       Assessment: 68 y/o male who presented to the ED on 7/11 with chest and epigastric pain. Code STEMI was called and patient was taken to the cath lab where and had clean coronaries. EGD found mild duodenitis. Pharmacy consulted to begin Zosyn for abdominal pain, fever, leukocytosis and gall bladder sludge concerning for cholecystitis. Renal function is normal.  Goal of Therapy:  Eradication of infection  Plan:  - Zosyn 3.375 g IV q8h to be infused over 4 hours - Monitor renal function, clinical progress and culture data  [facility], Pharm.D., BCPS Clinical Pharmacist Pager: [phone] 03/23/2014 6:59 PM

## 2014-03-24 ENCOUNTER — Encounter (HOSPITAL_COMMUNITY): Payer: Self-pay | Admitting: Gastroenterology

## 2014-03-24 DIAGNOSIS — R509 Fever, unspecified: Secondary | ICD-10-CM

## 2014-03-24 DIAGNOSIS — I2119 ST elevation (STEMI) myocardial infarction involving other coronary artery of inferior wall: Secondary | ICD-10-CM

## 2014-03-24 DIAGNOSIS — K8020 Calculus of gallbladder without cholecystitis without obstruction: Secondary | ICD-10-CM

## 2014-03-24 DIAGNOSIS — K810 Acute cholecystitis: Secondary | ICD-10-CM

## 2014-03-24 LAB — URINALYSIS, ROUTINE W REFLEX MICROSCOPIC
Hgb urine dipstick: NEGATIVE
KETONES UR: 15 mg/dL — AB
Leukocytes, UA: NEGATIVE
Nitrite: NEGATIVE
PROTEIN: 30 mg/dL — AB
Specific Gravity, Urine: 1.01 (ref 1.005–1.030)
Urobilinogen, UA: 4 mg/dL — ABNORMAL HIGH (ref 0.0–1.0)
pH: 6.5 (ref 5.0–8.0)

## 2014-03-24 LAB — URINE MICROSCOPIC-ADD ON

## 2014-03-24 LAB — GLUCOSE, CAPILLARY
GLUCOSE-CAPILLARY: 163 mg/dL — AB (ref 70–99)
GLUCOSE-CAPILLARY: 150 mg/dL — AB (ref 70–99)
GLUCOSE-CAPILLARY: 191 mg/dL — AB (ref 70–99)
GLUCOSE-CAPILLARY: 228 mg/dL — AB (ref 70–99)

## 2014-03-24 MED FILL — Fentanyl Citrate Inj 0.05 MG/ML: INTRAMUSCULAR | Qty: 2 | Status: AC

## 2014-03-24 MED FILL — Nitroglycerin IV Soln 200 MCG/ML in D5W: INTRAVENOUS | Qty: 1 | Status: AC

## 2014-03-24 MED FILL — Midazolam HCl Inj 2 MG/2ML (Base Equivalent): INTRAMUSCULAR | Qty: 2 | Status: AC

## 2014-03-24 MED FILL — Lidocaine HCl Local Preservative Free (PF) Inj 1%: INTRAMUSCULAR | Qty: 30 | Status: AC

## 2014-03-24 MED FILL — Heparin Sodium (Porcine) 2 Unit/ML in Sodium Chloride 0.9%: INTRAMUSCULAR | Qty: 3000 | Status: AC

## 2014-03-24 NOTE — Progress Notes (Signed)
Subjective: No chest pain, continues with abd pain, unable to eat, frustrated with continued discomfort.  Objective: Vital signs in last 24 hours: Temp:  [99.3 F (37.4 C)-100.9 F (38.3 C)] 100 F (37.8 C) (07/13 0506) Pulse Rate:  [55-81] 72 (07/13 0506) Resp:  [6-27] 18 (07/13 0506) BP: (87-125)/(42-66) 117/65 mmHg (07/13 0506) SpO2:  [88 %-99 %] 93 % (07/13 0506) Weight:  [187 lb 4.8 oz (84.959 kg)] 187 lb 4.8 oz (84.959 kg) (07/13 0506) Weight change: 10 lb 14.9 oz (4.959 kg) Last BM Date: 03/21/14 Intake/Output from previous day: 07/12 0701 - 07/13 0700 In: 1440 [P.O.:1000; I.V.:440] Out: -  Intake/Output this shift:    PE: General:Pleasant affect, NAD Skin:Warm and dry, brisk capillary refill HEENT:normocephalic, sclera clear, mucus membranes moist Heart:S1S2 RRR without murmur, gallup, rub or click Lungs:clear without rales, rhonchi, or wheezes CBS:WHQP,+ RUQ tenderness, + BS, do not palpate liver spleen or masses Ext:no lower ext edema, 2+ pedal pulses, 2+ radial pulses Neuro:alert and oriented X 3, MAE, follows commands, + facial symmetry   Lab Results:  Recent Labs  03/23/14 0510 03/23/14 1544  WBC 12.0* 12.8*  HGB 12.8* 14.7  HCT 38.9* 44.3  PLT 155 154   BMET  Recent Labs  03/22/14 1305 03/23/14 0510  NA 140 138  K 4.2 3.7  CL 100 99  CO2 24 24  GLUCOSE 234* 154*  BUN 17 18  CREATININE 0.63 0.80  CALCIUM 9.1 8.2*    Recent Labs  03/22/14 1900 03/22/14 2245  TROPONINI <0.30 <0.30         Hepatic Function Panel  Recent Labs  03/22/14 1305  PROT 7.0  ALBUMIN 3.9  AST 35  ALT 37  ALKPHOS 69  BILITOT 0.4   No results found for this basename: CHOL,  in the last 72 hours No results found for this basename: PROTIME,  in the last 72 hours     Studies/Results: US Abdomen Complete  03/22/2014   CLINICAL DATA:  Abdominal pain.  EXAM: ULTRASOUND ABDOMEN COMPLETE  COMPARISON:  CT abdomen and pelvis 12/13/2012.   FINDINGS: Gallbladder:  A few small stones are seen in the gallbladder measuring up to 0.8 cm. There is also some sludge present. No gallbladder wall thickening or pericholecystic fluid. Sonographer reports negative Murphy's sign.  Common bile duct:  Diameter: 0.4 cm.  Liver:  Demonstrates coarsened and increased echogenicity consistent with fatty infiltration. No focal lesion or intrahepatic biliary ductal dilatation.  IVC:  No abnormality visualized.  Pancreas:  Visualized portion unremarkable.  Spleen:  Size and appearance within normal limits.  Right Kidney:  Length: 10.5 cm. Echogenicity within normal limits. No mass or No hydronephrosis visualized.  Left Kidney:  Length: 13.0 cm. Echogenicity within normal limits. 1.1 cm simple cyst off the lower pole noted. No hydronephrosis visualized.  Abdominal aorta:  No aneurysm visualized.  Other findings:  None.  IMPRESSION: Small gallstones and a small amount of gallbladder sludge without evidence of cholecystitis.  Fatty infiltration of the liver.   Electronically Signed   By: Inge Rise M.D.   On: 03/22/2014 16:21   Ct Abdomen Pelvis W Contrast  03/23/2014   CLINICAL DATA:  Elevated white blood cell count. Epigastric abdominal pain.  EXAM: CT ABDOMEN AND PELVIS WITH CONTRAST  TECHNIQUE: Multidetector CT imaging of the abdomen and pelvis was performed using the standard protocol following bolus administration of intravenous contrast.  CONTRAST:  100 mL OMNIPAQUE IOHEXOL 300 MG/ML  SOLN  COMPARISON:  CT abdomen and pelvis 12/13/2012.  FINDINGS: Trace bilateral pleural effusions are present. There is no pericardial effusion. Heart size is mildly enlarged. Lung bases demonstrate some atelectasis.  The gallbladder appears distended and there is some stranding about the gallbladder. The liver is somewhat low attenuating compatible with fatty infiltration. No focal liver lesion is seen. The spleen, adrenal glands, pancreas and kidneys are unremarkable with a small  amount of perinephric stranding seen. There is aortoiliac atherosclerosis without aneurysm. Fat containing inguinal hernias are seen, larger on the left. The patient has diverticular disease of the colon which appears fairly extensive in the sigmoid but there is no evidence of diverticulitis. The colon is otherwise unremarkable. No lymphadenopathy is identified. No lytic or sclerotic bony lesion is seen.  IMPRESSION: The gallbladder is distended with stranding about it worrisome for cholecystitis. Right upper quadrant ultrasound would be useful for further evaluation.  Fatty infiltration of the liver.  Trace bilateral pleural effusions.  Mild cardiomegaly.  Diverticulosis without diverticulitis.  Small fat containing inguinal hernias, larger on the left.   Electronically Signed   By: Inge Rise M.D.   On: 03/23/2014 19:57   Dg Chest Port 1 View  03/22/2014   CLINICAL DATA:  Chest pain  EXAM: PORTABLE CHEST - 1 VIEW  COMPARISON:  None.  FINDINGS: Cardiac shadow is mildly enlarged. Mild vascular congestion is seen. No focal infiltrate or sizable effusion is noted.  IMPRESSION: Mild CHF.   Electronically Signed   By: Inez Catalina M.D.   On: 03/22/2014 10:09    Medications: I have reviewed the patient's current medications. Scheduled Meds: . heparin  5,000 Units Subcutaneous 3 times per day  . insulin aspart  0-15 Units Subcutaneous TID WC  . pantoprazole  40 mg Oral Daily  . piperacillin-tazobactam (ZOSYN)  IV  3.375 g Intravenous Q8H   Continuous Infusions: . sodium chloride Stopped (03/23/14 1239)  . sodium chloride     PRN Meds:.acetaminophen, morphine injection, nitroGLYCERIN, ondansetron (ZOFRAN) IV, oxyCODONE-acetaminophen  Assessment/Plan: Active Problems:   Anginal chest pain at rest-  Cath with no CAD and normal aorta, normal LV function.     Type 2 diabetes mellitus gl [id number]   Chest pain at rest   Fever, unspecified- continued 100 temp this am, possible gallbladder -see ct abd  - needs  Surgical consult.   Leukocytosis, unspecified    LOS: 2 days   Time spent with pt. :15 minutes. [facility] R  Nurse Practitioner Certified Pager [phone] or after 5pm and on weekends call [phone] 03/24/2014, 9:37 AM    Patient seen and examined. Agree with assessment and plan. No chest pain. Gallstones with sludge and fatty liver. Mild RUQ tenderness. Cardiac wise appears stable. Will sign off; call if needed.   Troy Sine, MD, [facility] 03/24/2014 12:14 PM

## 2014-03-24 NOTE — Care Management Note (Unsigned)
    Page 1 of 1   03/24/2014     12:04:40 PM CARE MANAGEMENT NOTE 03/24/2014  Patient:  Christopher Arnold,Christopher Arnold   Account Number:  [id number]  Date Initiated:  03/24/2014  Documentation initiated by:  GRAVES-BIGELOW,Swayze Kozuch  Subjective/Objective Assessment:   Pt admitted for cp @ rest. S/p cath and now GI following up for increased abdominal pain.     Action/Plan:   CM will continue to monitor for disposition needs.   Anticipated DC Date:  03/26/2014   Anticipated DC Plan:  [place]  CM consult      Choice offered to / List presented to:             Status of service:  In process, will continue to follow Medicare Important Message given?  YES (If response is "NO", the following Medicare IM given date fields will be blank) Date Medicare IM given:  03/24/2014 Medicare IM given by:  GRAVES-BIGELOW,Dayja Loveridge Date Additional Medicare IM given:   Additional Medicare IM given by:    Discharge Disposition:    Per UR Regulation:  Reviewed for med. necessity/level of care/duration of stay  If discussed at [place] of Stay Meetings, dates discussed:    Comments:

## 2014-03-24 NOTE — Progress Notes (Signed)
UR Completed Lucynda Rosano Graves-Bigelow, RN,BSN [phone]  

## 2014-03-24 NOTE — Progress Notes (Signed)
Daily Rounding Note  03/24/2014, 10:13 AM  LOS: 2 days   SUBJECTIVE:       Still has epigastric pain and Bil UQ pain.  No nausea.  No better with Protonix and Zosyn.  No nausea.  Poor appetite  OBJECTIVE:         Vital signs in last 24 hours:    Temp:  [99.3 F (37.4 C)-100.9 F (38.3 C)] 100 F (37.8 C) (07/13 0506) Pulse Rate:  [55-81] 72 (07/13 0506) Resp:  [6-27] 18 (07/13 0506) BP: (87-125)/(42-66) 117/65 mmHg (07/13 0506) SpO2:  [93 %-99 %] 93 % (07/13 0506) Weight:  [84.959 kg (187 lb 4.8 oz)] 84.959 kg (187 lb 4.8 oz) (07/13 0506) Last BM Date: 03/21/14 General: looks well.    Heart: RRR Chest: clear.  No cough or dyspnea Abdomen: soft, ND.  Tender across upper ebdomen bil.  No guard or rebound  Extremities: no CCE Neuro/Psych:  Pleasant. Oriented x 3.  No limb weakness.   Intake/Output from previous day: 07/12 0701 - 07/13 0700 In: 1440 [P.O.:1000; I.V.:440] Out: -   Intake/Output this shift:    Lab Results:  Recent Labs  03/22/14 1305 03/23/14 0510 03/23/14 1544  WBC 13.9* 12.0* 12.8*  HGB 14.2 12.8* 14.7  HCT 41.8 38.9* 44.3  PLT 173 155 154   BMET  Recent Labs  03/22/14 0942 03/22/14 1305 03/23/14 0510  NA 137 140 138  K 4.1 4.2 3.7  CL 99 100 99  CO2 23 24 24   GLUCOSE 286* 234* 154*  BUN 20 17 18   CREATININE 0.77 0.63 0.80  CALCIUM 9.8 9.1 8.2*   LFT  Recent Labs  03/22/14 1305  PROT 7.0  ALBUMIN 3.9  AST 35  ALT 37  ALKPHOS 69  BILITOT 0.4   PT/INR  Recent Labs  03/22/14 0955  LABPROT 13.1  INR 0.99    Studies/Results: US Abdomen Complete 03/22/2014  COMPARISON:  CT abdomen and pelvis 12/13/2012.  FINDINGS: Gallbladder:  A few small stones are seen in the gallbladder measuring up to 0.8 cm. There is also some sludge present. No gallbladder wall thickening or pericholecystic fluid. Sonographer reports negative Murphy's sign.  Common bile duct:  Diameter: 0.4  cm.  Liver:  Demonstrates coarsened and increased echogenicity consistent with fatty infiltration. No focal lesion or intrahepatic biliary ductal dilatation.  IVC:  No abnormality visualized.  Pancreas:  Visualized portion unremarkable.  Spleen:  Size and appearance within normal limits.  Right Kidney:  Length: 10.5 cm. Echogenicity within normal limits. No mass or No hydronephrosis visualized.  Left Kidney:  Length: 13.0 cm. Echogenicity within normal limits. 1.1 cm simple cyst off the lower pole noted. No hydronephrosis visualized.  Abdominal aorta:  No aneurysm visualized.  Other findings:  None.  IMPRESSION: Small gallstones and a small amount of gallbladder sludge without evidence of cholecystitis.  Fatty infiltration of the liver.   Electronically Signed   By: Inge Rise M.D.   On: 03/22/2014 16:21   Ct Abdomen Pelvis W Contrast 03/23/2014   COMPARISON:  CT abdomen and pelvis 12/13/2012.  FINDINGS: Trace bilateral pleural effusions are present. There is no pericardial effusion. Heart size is mildly enlarged. Lung bases demonstrate some atelectasis.  The gallbladder appears distended and there is some stranding about the gallbladder. The liver is somewhat low attenuating compatible with fatty infiltration. No focal liver lesion is seen. The spleen, adrenal glands, pancreas and kidneys are unremarkable with a  small amount of perinephric stranding seen. There is aortoiliac atherosclerosis without aneurysm. Fat containing inguinal hernias are seen, larger on the left. The patient has diverticular disease of the colon which appears fairly extensive in the sigmoid but there is no evidence of diverticulitis. The colon is otherwise unremarkable. No lymphadenopathy is identified. No lytic or sclerotic bony lesion is seen.  IMPRESSION: The gallbladder is distended with stranding about it worrisome for cholecystitis. Right upper quadrant ultrasound would be useful for further evaluation.  Fatty infiltration of  the liver.  Trace bilateral pleural effusions.  Mild cardiomegaly.  Diverticulosis without diverticulitis.  Small fat containing inguinal hernias, larger on the left.   Electronically Signed   By: Inge Rise M.D.   On: 03/23/2014 19:57   Scheduled Meds: . heparin  5,000 Units Subcutaneous 3 times per day  . insulin aspart  0-15 Units Subcutaneous TID WC  . pantoprazole  40 mg Oral Daily  . piperacillin-tazobactam (ZOSYN)  IV  3.375 g Intravenous Q8H   Continuous Infusions: . sodium chloride Stopped (03/23/14 1239)   PRN Meds:.acetaminophen, morphine injection, nitroGLYCERIN, ondansetron (ZOFRAN) IV, oxyCODONE-acetaminophen  ASSESMENT:   *  Epigastric pain, ongoing.  Now with fever.  WBCs improved.  EGD 7/12: mild duodenitis at bulb.  On po Protonix.  Gallstones, CT suggest Cholecystitis.  LFTs normal 3 days ago.  On Zosyn. Do not yet see surgical consult as not yet called.   *  Fatty liver.   *  DM, diet controlled at home.    PLAN   * Needs Surg eval. I called and talked to [facility], surgical PA.     Christopher Arnold  03/24/2014, 10:13 AM Pager: [phone]  Clinical course and xray findings c/w acute cholecystitis.  Agree with plans for surgery.  Repeat LFTs.

## 2014-03-24 NOTE — Consult Note (Signed)
I have seen and examined the patient and agree with the assessment and plans. I suspect biliary colic.  Lap chole reasonable. I discussed the procedure in detail.   We discussed the risks and benefits of a laparoscopic cholecystectomy and possible cholangiogram including, but not limited to bleeding, infection, injury to surrounding structures such as the intestine or liver, bile leak, retained gallstones, need to convert to an open procedure, prolonged diarrhea, blood clots such as  DVT, common bile duct injury, anesthesia risks, and possible need for additional procedures.  The likelihood of improvement in symptoms and return to the patient's normal status is good. We discussed the typical post-operative recovery course.    Inmer Nix A. Ninfa Linden  MD, FACS

## 2014-03-24 NOTE — Progress Notes (Addendum)
Pt temp 102.6, Dr Wendee Beavers notified and stated that the pt will remain on IV Zosyn and to give the pt a dose of tylenol. Will continue to monitor.   Pt refused tylenol.  Will continue to monitor.

## 2014-03-24 NOTE — Consult Note (Signed)
Christopher Arnold Jul 16, 1946  [id number].   Requesting MD: Dr. Erskine Emery  Chief Complaint/Reason for Consult: Gallstones HPI: This is a 68 year old correlation male who began having epigastric abdominal pain last Friday. He states that it began out of no air. It was not exacerbated by food or drink. It went across his upper abdomen to the right and to the left. He denied any nausea, vomiting, diarrhea, fevers, or chills. He presented to [facility] emergency department with complaints of chest pain. He was worked up from a cardiac standpoint which was negative. GI evaluated the patient as well. He had an upper endoscopy which showed some mild gastritis but no other acute findings. His pain continued to persist. He had an abdominal ultrasound which revealed multiple gallstones but no evidence of cholecystitis. He ran a low-grade fever of 100.9 and his white blood cell count has remained elevated around 12.8. A CT scan was completed which revealed gallbladder distention and some stranding around the gallbladder consistent with cholecystitis. We have been asked to dilate the patient for further evaluation.  ROS please see history of present illness otherwise all other systems have been reviewed and are negative.  History reviewed. No pertinent family history.  Past Medical History  Diagnosis Date  . Diabetes mellitus without complication     Past Surgical History  Procedure Laterality Date  . Appendectomy  remote  . Esophagogastroduodenoscopy N/A 03/23/2014    Procedure: ESOPHAGOGASTRODUODENOSCOPY (EGD);  Surgeon: Juanita Craver, MD;  Location: [facility] ENDOSCOPY;  Service: Endoscopy;  Laterality: N/A;    Social History:  reports that he has quit smoking. He does not have any smokeless tobacco history on file. He reports that he does not drink alcohol or use illicit drugs.  Allergies: No Known Allergies  Medications Prior to Admission  Medication Sig Dispense Refill  . Linagliptin-Metformin HCl  (JENTADUETO) 2.01-999 MG TABS Take 1 tablet by mouth 2 (two) times daily with a meal.      . naproxen (NAPROSYN) 500 MG tablet Take 500 mg by mouth 2 (two) times daily with a meal.        Blood pressure 117/65, pulse 72, temperature 100 F (37.8 C), temperature source Oral, resp. rate 18, height 5' 8"  (1.727 m), weight 187 lb 4.8 oz (84.959 kg), SpO2 93.00%. Physical Exam: General: pleasant, WD, WN white male who is laying in bed in NAD HEENT: head is normocephalic, atraumatic.  Sclera are noninjected.  PERRL.  Ears and nose without any masses or lesions.  Mouth is pink and moist Heart: regular, rate, and rhythm.  Normal s1,s2. No obvious murmurs, gallops, or rubs noted.  Palpable radial and pedal pulses bilaterally Lungs: CTAB, no wheezes, rhonchi, or rales noted.  Respiratory effort nonlabored Abd: soft, mildly tender across his upper abdomen, ND, +BS, no masses, hernias, or organomegaly MS: all 4 extremities are symmetrical with no cyanosis, clubbing, or edema. Skin: warm and dry with no masses, lesions, or rashes Psych: A&Ox3 with an appropriate affect.    Results for orders placed during the hospital encounter of 03/22/14 (from the past 48 hour(s))  TROPONIN I     Status: None   Collection Time    03/22/14  1:05 PM      Result Value Ref Range   Troponin I <0.30  <0.30 ng/mL   Comment:            Due to the release kinetics of cTnI,     a negative result within the first hours  of the onset of symptoms does not rule out     myocardial infarction with certainty.     If myocardial infarction is still suspected,     repeat the test at appropriate intervals.  COMPREHENSIVE METABOLIC PANEL     Status: Abnormal   Collection Time    03/22/14  1:05 PM      Result Value Ref Range   Sodium 140  137 - 147 mEq/L   Potassium 4.2  3.7 - 5.3 mEq/L   Chloride 100  96 - 112 mEq/L   CO2 24  19 - 32 mEq/L   Glucose, Bld 234 (*) 70 - 99 mg/dL   BUN 17  6 - 23 mg/dL   Creatinine, Ser 0.63   0.50 - 1.35 mg/dL   Calcium 9.1  8.4 - 10.5 mg/dL   Total Protein 7.0  6.0 - 8.3 g/dL   Albumin 3.9  3.5 - 5.2 g/dL   AST 35  0 - 37 U/L   ALT 37  0 - 53 U/L   Alkaline Phosphatase 69  39 - 117 U/L   Total Bilirubin 0.4  0.3 - 1.2 mg/dL   GFR calc non Af Amer >90  >90 mL/min   GFR calc Af Amer >90  >90 mL/min   Comment: (NOTE)     The eGFR has been calculated using the CKD EPI equation.     This calculation has not been validated in all clinical situations.     eGFR's persistently <90 mL/min signify possible Chronic Kidney     Disease.   Anion gap 16 (*) 5 - 15  CBC     Status: Abnormal   Collection Time    03/22/14  1:05 PM      Result Value Ref Range   WBC 13.9 (*) 4.0 - 10.5 K/uL   RBC 4.73  4.22 - 5.81 MIL/uL   Hemoglobin 14.2  13.0 - 17.0 g/dL   HCT 41.8  39.0 - 52.0 %   MCV 88.4  78.0 - 100.0 fL   MCH 30.0  26.0 - 34.0 pg   MCHC 34.0  30.0 - 36.0 g/dL   RDW 12.6  11.5 - 15.5 %   Platelets 173  150 - 400 K/uL  AMYLASE     Status: None   Collection Time    03/22/14  1:05 PM      Result Value Ref Range   Amylase 25  0 - 105 U/L  LIPASE, BLOOD     Status: None   Collection Time    03/22/14  1:05 PM      Result Value Ref Range   Lipase 17  11 - 59 U/L  HEMOGLOBIN A1C     Status: Abnormal   Collection Time    03/22/14  1:05 PM      Result Value Ref Range   Hemoglobin A1C 7.6 (*) <5.7 %   Comment: (NOTE)                                                                               According to the ADA Clinical Practice Recommendations for 2011, when     HbA1c is used as a  screening test:      >=6.5%   Diagnostic of Diabetes Mellitus               (if abnormal result is confirmed)     5.7-6.4%   Increased risk of developing Diabetes Mellitus     References:Diagnosis and Classification of Diabetes Mellitus,Diabetes     OITG,5498,26(EBRAX 1):S62-S69 and Standards of Medical Care in             Diabetes - 2011,Diabetes Care,2011,34 (Suppl 1):S11-S61.   Mean Plasma  Glucose 171 (*) <117 mg/dL   Comment: Performed at [place], CAPILLARY     Status: Abnormal   Collection Time    03/22/14  1:42 PM      Result Value Ref Range   Glucose-Capillary 233 (*) 70 - 99 mg/dL  GLUCOSE, CAPILLARY     Status: Abnormal   Collection Time    03/22/14  3:36 PM      Result Value Ref Range   Glucose-Capillary 194 (*) 70 - 99 mg/dL  TROPONIN I     Status: None   Collection Time    03/22/14  7:00 PM      Result Value Ref Range   Troponin I <0.30  <0.30 ng/mL   Comment:            Due to the release kinetics of cTnI,     a negative result within the first hours     of the onset of symptoms does not rule out     myocardial infarction with certainty.     If myocardial infarction is still suspected,     repeat the test at appropriate intervals.  GLUCOSE, CAPILLARY     Status: Abnormal   Collection Time    03/22/14  8:25 PM      Result Value Ref Range   Glucose-Capillary 136 (*) 70 - 99 mg/dL   Comment 1 Documented in Chart     Comment 2 Notify RN    TROPONIN I     Status: None   Collection Time    03/22/14 10:45 PM      Result Value Ref Range   Troponin I <0.30  <0.30 ng/mL   Comment:            Due to the release kinetics of cTnI,     a negative result within the first hours     of the onset of symptoms does not rule out     myocardial infarction with certainty.     If myocardial infarction is still suspected,     repeat the test at appropriate intervals.  CBC     Status: Abnormal   Collection Time    03/23/14  5:10 AM      Result Value Ref Range   WBC 12.0 (*) 4.0 - 10.5 K/uL   RBC 4.40  4.22 - 5.81 MIL/uL   Hemoglobin 12.8 (*) 13.0 - 17.0 g/dL   HCT 38.9 (*) 39.0 - 52.0 %   MCV 88.4  78.0 - 100.0 fL   MCH 29.1  26.0 - 34.0 pg   MCHC 32.9  30.0 - 36.0 g/dL   RDW 13.0  11.5 - 15.5 %   Platelets 155  150 - 400 K/uL  BASIC METABOLIC PANEL     Status: Abnormal   Collection Time    03/23/14  5:10 AM      Result Value Ref Range    Sodium 138  137 -  147 mEq/L   Potassium 3.7  3.7 - 5.3 mEq/L   Chloride 99  96 - 112 mEq/L   CO2 24  19 - 32 mEq/L   Glucose, Bld 154 (*) 70 - 99 mg/dL   BUN 18  6 - 23 mg/dL   Creatinine, Ser 0.80  0.50 - 1.35 mg/dL   Calcium 8.2 (*) 8.4 - 10.5 mg/dL   GFR calc non Af Amer >90  >90 mL/min   GFR calc Af Amer >90  >90 mL/min   Comment: (NOTE)     The eGFR has been calculated using the CKD EPI equation.     This calculation has not been validated in all clinical situations.     eGFR's persistently <90 mL/min signify possible Chronic Kidney     Disease.   Anion gap 15  5 - 15  GLUCOSE, CAPILLARY     Status: Abnormal   Collection Time    03/23/14  8:24 AM      Result Value Ref Range   Glucose-Capillary 155 (*) 70 - 99 mg/dL  CBC     Status: Abnormal   Collection Time    03/23/14  3:44 PM      Result Value Ref Range   WBC 12.8 (*) 4.0 - 10.5 K/uL   RBC 4.83  4.22 - 5.81 MIL/uL   Hemoglobin 14.7  13.0 - 17.0 g/dL   HCT 44.3  39.0 - 52.0 %   MCV 91.7  78.0 - 100.0 fL   MCH 30.4  26.0 - 34.0 pg   MCHC 33.2  30.0 - 36.0 g/dL   RDW 13.1  11.5 - 15.5 %   Platelets 154  150 - 400 K/uL  HEMOGLOBIN A1C     Status: Abnormal   Collection Time    03/23/14  3:44 PM      Result Value Ref Range   Hemoglobin A1C 7.5 (*) <5.7 %   Comment: (NOTE)                                                                               According to the ADA Clinical Practice Recommendations for 2011, when     HbA1c is used as a screening test:      >=6.5%   Diagnostic of Diabetes Mellitus               (if abnormal result is confirmed)     5.7-6.4%   Increased risk of developing Diabetes Mellitus     References:Diagnosis and Classification of Diabetes Mellitus,Diabetes     RFFM,3846,65(LDJTT 1):S62-S69 and Standards of Medical Care in             Diabetes - 2011,Diabetes Care,2011,34 (Suppl 1):S11-S61.   Mean Plasma Glucose 169 (*) <117 mg/dL   Comment: Performed at [place],  [facility]     Status: Abnormal   Collection Time    03/23/14  4:52 PM      Result Value Ref Range   Glucose-Capillary 213 (*) 70 - 99 mg/dL  GLUCOSE, CAPILLARY     Status: Abnormal   Collection Time    03/23/14 11:34 PM      Result Value Ref  Range   Glucose-Capillary 226 (*) 70 - 99 mg/dL   Comment 1 Notify RN    URINALYSIS, ROUTINE W REFLEX MICROSCOPIC     Status: Abnormal   Collection Time    03/24/14  2:42 AM      Result Value Ref Range   Color, Urine AMBER (*) YELLOW   Comment: BIOCHEMICALS MAY BE AFFECTED BY COLOR   APPearance CLEAR  CLEAR   Specific Gravity, Urine 1.010  1.005 - 1.030   pH 6.5  5.0 - 8.0   Glucose, UA >1000 (*) NEGATIVE mg/dL   Hgb urine dipstick NEGATIVE  NEGATIVE   Bilirubin Urine SMALL (*) NEGATIVE   Ketones, ur 15 (*) NEGATIVE mg/dL   Protein, ur 30 (*) NEGATIVE mg/dL   Urobilinogen, UA 4.0 (*) 0.0 - 1.0 mg/dL   Nitrite NEGATIVE  NEGATIVE   Leukocytes, UA NEGATIVE  NEGATIVE  URINE MICROSCOPIC-ADD ON     Status: None   Collection Time    03/24/14  2:42 AM      Result Value Ref Range   Squamous Epithelial / LPF RARE  RARE   Bacteria, UA RARE  RARE   Urine-Other FEW YEAST    GLUCOSE, CAPILLARY     Status: Abnormal   Collection Time    03/24/14  7:33 AM      Result Value Ref Range   Glucose-Capillary 163 (*) 70 - 99 mg/dL   Comment 1 Notify RN    GLUCOSE, CAPILLARY     Status: Abnormal   Collection Time    03/24/14 11:27 AM      Result Value Ref Range   Glucose-Capillary 150 (*) 70 - 99 mg/dL   Comment 1 Notify RN     US Abdomen Complete  03/22/2014   CLINICAL DATA:  Abdominal pain.  EXAM: ULTRASOUND ABDOMEN COMPLETE  COMPARISON:  CT abdomen and pelvis 12/13/2012.  FINDINGS: Gallbladder:  A few small stones are seen in the gallbladder measuring up to 0.8 cm. There is also some sludge present. No gallbladder wall thickening or pericholecystic fluid. Sonographer reports negative Murphy's sign.  Common bile duct:  Diameter: 0.4 cm.  Liver:   Demonstrates coarsened and increased echogenicity consistent with fatty infiltration. No focal lesion or intrahepatic biliary ductal dilatation.  IVC:  No abnormality visualized.  Pancreas:  Visualized portion unremarkable.  Spleen:  Size and appearance within normal limits.  Right Kidney:  Length: 10.5 cm. Echogenicity within normal limits. No mass or No hydronephrosis visualized.  Left Kidney:  Length: 13.0 cm. Echogenicity within normal limits. 1.1 cm simple cyst off the lower pole noted. No hydronephrosis visualized.  Abdominal aorta:  No aneurysm visualized.  Other findings:  None.  IMPRESSION: Small gallstones and a small amount of gallbladder sludge without evidence of cholecystitis.  Fatty infiltration of the liver.   Electronically Signed   By: Inge Rise M.D.   On: 03/22/2014 16:21   Ct Abdomen Pelvis W Contrast  03/23/2014   CLINICAL DATA:  Elevated white blood cell count. Epigastric abdominal pain.  EXAM: CT ABDOMEN AND PELVIS WITH CONTRAST  TECHNIQUE: Multidetector CT imaging of the abdomen and pelvis was performed using the standard protocol following bolus administration of intravenous contrast.  CONTRAST:  100 mL OMNIPAQUE IOHEXOL 300 MG/ML  SOLN  COMPARISON:  CT abdomen and pelvis 12/13/2012.  FINDINGS: Trace bilateral pleural effusions are present. There is no pericardial effusion. Heart size is mildly enlarged. Lung bases demonstrate some atelectasis.  The gallbladder appears distended and there is  some stranding about the gallbladder. The liver is somewhat low attenuating compatible with fatty infiltration. No focal liver lesion is seen. The spleen, adrenal glands, pancreas and kidneys are unremarkable with a small amount of perinephric stranding seen. There is aortoiliac atherosclerosis without aneurysm. Fat containing inguinal hernias are seen, larger on the left. The patient has diverticular disease of the colon which appears fairly extensive in the sigmoid but there is no evidence of  diverticulitis. The colon is otherwise unremarkable. No lymphadenopathy is identified. No lytic or sclerotic bony lesion is seen.  IMPRESSION: The gallbladder is distended with stranding about it worrisome for cholecystitis. Right upper quadrant ultrasound would be useful for further evaluation.  Fatty infiltration of the liver.  Trace bilateral pleural effusions.  Mild cardiomegaly.  Diverticulosis without diverticulitis.  Small fat containing inguinal hernias, larger on the left.   Electronically Signed   By: Inge Rise M.D.   On: 03/23/2014 19:57       Assessment/Plan 1. Biliary colic, possible early acute cholecystitis 2. Diabetes mellitus  Plan: The patient's symptoms are not classic biliary colic as most of his pain is currently on the left side; however, he does have gallstones in his ultrasound and an updated CT scan reveals some stranding and distention of his gallbladder. These findings are suggestive of possible early cholecystitis. I have discussed with the patient that he very likely needs a cholecystectomy. He understands this and is agreeable. We will let him have clear liquids today and make him n.p.o. after midnight in preparation for possible surgical intervention tomorrow. Edith Groleau E 03/24/2014, 12:26 PM Pager: [phone]

## 2014-03-24 NOTE — Progress Notes (Signed)
[facility] PROGRESS NOTE  Christopher Arnold YQM:[id number] DOB: [birth year] DOA: 03/22/2014 PCP: No primary provider on file.  Assessment/Plan: 1. Right upper quadrant abdominal pain, fever, leukocytosis and abnormal US showing gall bladder sludge: - CT abd and pelvis ordered to further evaluate cholecystitis. Findings reported gallbladder is distended with stranding about it worrisome for cholecystitis. General surgery consulted - empirically started him on IV zosyn.  - blood cultures ordered and pending.    2. Mild duodenitis: On PPI and zosyn  3. CHEST PAIN: S/p cath with no CAD, and normal LV function. Cardiology has signed off.   4. Diabetes Mellitus:  hgba1c is 7.4 SSI and diabetic diet when taking po  dvt prophylaxis.   Code Status: full code.  Family Communication: discussed with patient. Disposition Plan: transfer to telemetry.    Consultants:  GI  General surgery  Procedures:  US abdomen  CT of abd and pelvis  Antibiotics:  Zosyn  HPI/Subjective: Intermittent abdominal and chest pain. Pain tolerable on current regimen.  Objective: Filed Vitals:   03/24/14 1342  BP: 124/66  Pulse: 81  Temp: 102.6 F (39.2 C)  Resp: 16    Intake/Output Summary (Last 24 hours) at 03/24/14 1623 Last data filed at 03/24/14 1300  Gross per 24 hour  Intake    920 ml  Output      0 ml  Net    920 ml   Filed Weights   03/22/14 1230 03/22/14 1300 03/24/14 0506  Weight: 86.7 kg (191 lb 2.2 oz) 86.7 kg (191 lb 2.2 oz) 84.959 kg (187 lb 4.8 oz)    Exam:   General:  Alert afebrile comfortable  Cardiovascular: s1 s2, no murmurs  Respiratory: ctab, no wheezes  Abdomen: soft ND BS+, tenderness at RUQ  Musculoskeletal: no pedal edema.   Data Reviewed: Basic Metabolic Panel:  Recent Labs Lab 03/22/14 0942 03/22/14 1305 03/23/14 0510  NA 137 140 138  K 4.1 4.2 3.7  CL 99 100 99  CO2 23 24 24   GLUCOSE 286* 234* 154*  BUN 20 17 18   CREATININE  0.77 0.63 0.80  CALCIUM 9.8 9.1 8.2*   Liver Function Tests:  Recent Labs Lab 03/22/14 1305  AST 35  ALT 37  ALKPHOS 69  BILITOT 0.4  PROT 7.0  ALBUMIN 3.9    Recent Labs Lab 03/22/14 1305  LIPASE 17  AMYLASE 25   No results found for this basename: AMMONIA,  in the last 168 hours CBC:  Recent Labs Lab 03/22/14 0942 03/22/14 1305 03/23/14 0510 03/23/14 1544  WBC 13.4* 13.9* 12.0* 12.8*  HGB 14.8 14.2 12.8* 14.7  HCT 43.4 41.8 38.9* 44.3  MCV 87.9 88.4 88.4 91.7  PLT 175 173 155 154   Cardiac Enzymes:  Recent Labs Lab 03/22/14 0955 03/22/14 1305 03/22/14 1900 03/22/14 2245  TROPONINI <0.30 <0.30 <0.30 <0.30   BNP (last 3 results) No results found for this basename: PROBNP,  in the last 8760 hours CBG:  Recent Labs Lab 03/23/14 0824 03/23/14 1652 03/23/14 2334 03/24/14 0733 03/24/14 1127  GLUCAP 155* 213* 226* 163* 150*    Recent Results (from the past 240 hour(s))  MRSA PCR SCREENING     Status: None   Collection Time    03/22/14 12:13 PM      Result Value Ref Range Status   MRSA by PCR NEGATIVE  NEGATIVE Final   Comment:            The GeneXpert MRSA Assay (FDA  approved for NASAL specimens     only), is one component of a     comprehensive MRSA colonization     surveillance program. It is not     intended to diagnose MRSA     infection nor to guide or     monitor treatment for     MRSA infections.     Studies: Ct Abdomen Pelvis W Contrast  03/23/2014   CLINICAL DATA:  Elevated white blood cell count. Epigastric abdominal pain.  EXAM: CT ABDOMEN AND PELVIS WITH CONTRAST  TECHNIQUE: Multidetector CT imaging of the abdomen and pelvis was performed using the standard protocol following bolus administration of intravenous contrast.  CONTRAST:  100 mL OMNIPAQUE IOHEXOL 300 MG/ML  SOLN  COMPARISON:  CT abdomen and pelvis 12/13/2012.  FINDINGS: Trace bilateral pleural effusions are present. There is no pericardial effusion. Heart size is  mildly enlarged. Lung bases demonstrate some atelectasis.  The gallbladder appears distended and there is some stranding about the gallbladder. The liver is somewhat low attenuating compatible with fatty infiltration. No focal liver lesion is seen. The spleen, adrenal glands, pancreas and kidneys are unremarkable with a small amount of perinephric stranding seen. There is aortoiliac atherosclerosis without aneurysm. Fat containing inguinal hernias are seen, larger on the left. The patient has diverticular disease of the colon which appears fairly extensive in the sigmoid but there is no evidence of diverticulitis. The colon is otherwise unremarkable. No lymphadenopathy is identified. No lytic or sclerotic bony lesion is seen.  IMPRESSION: The gallbladder is distended with stranding about it worrisome for cholecystitis. Right upper quadrant ultrasound would be useful for further evaluation.  Fatty infiltration of the liver.  Trace bilateral pleural effusions.  Mild cardiomegaly.  Diverticulosis without diverticulitis.  Small fat containing inguinal hernias, larger on the left.   Electronically Signed   By: Inge Rise M.D.   On: 03/23/2014 19:57    Scheduled Meds: . heparin  5,000 Units Subcutaneous 3 times per day  . insulin aspart  0-15 Units Subcutaneous TID WC  . pantoprazole  40 mg Oral Daily  . piperacillin-tazobactam (ZOSYN)  IV  3.375 g Intravenous Q8H   Continuous Infusions: . sodium chloride Stopped (03/23/14 1239)    Active Problems:   Anginal chest pain at rest   Type 2 diabetes mellitus   Chest pain at rest   Fever, unspecified   Leukocytosis, unspecified    Time spent: 35 minutes    Velvet Bathe  [facility] Pager [phone]. If 7PM-7AM, please contact night-coverage at [email or web address], password [facility] 03/24/2014, 4:23 PM  LOS: 2 days

## 2014-03-25 ENCOUNTER — Encounter (HOSPITAL_COMMUNITY): Payer: Self-pay | Admitting: Certified Registered"

## 2014-03-25 ENCOUNTER — Inpatient Hospital Stay (HOSPITAL_COMMUNITY): Payer: 59 | Admitting: Certified Registered"

## 2014-03-25 ENCOUNTER — Encounter (HOSPITAL_COMMUNITY)
Admission: EM | Disposition: A | Discharge status: Home / Self Care | Payer: Self-pay | Source: Home / Self Care | Attending: Family Medicine

## 2014-03-25 ENCOUNTER — Encounter (HOSPITAL_COMMUNITY): Payer: 59 | Admitting: Certified Registered"

## 2014-03-25 DIAGNOSIS — K8010 Calculus of gallbladder with chronic cholecystitis without obstruction: Secondary | ICD-10-CM

## 2014-03-25 DIAGNOSIS — I2119 ST elevation (STEMI) myocardial infarction involving other coronary artery of inferior wall: Secondary | ICD-10-CM | POA: Diagnosis not present

## 2014-03-25 DIAGNOSIS — K8000 Calculus of gallbladder with acute cholecystitis without obstruction: Secondary | ICD-10-CM | POA: Diagnosis not present

## 2014-03-25 DIAGNOSIS — K8020 Calculus of gallbladder without cholecystitis without obstruction: Secondary | ICD-10-CM

## 2014-03-25 HISTORY — PX: CHOLECYSTECTOMY: SHX55

## 2014-03-25 LAB — GLUCOSE, CAPILLARY
GLUCOSE-CAPILLARY: 166 mg/dL — AB (ref 70–99)
Glucose-Capillary: 141 mg/dL — ABNORMAL HIGH (ref 70–99)
Glucose-Capillary: 151 mg/dL — ABNORMAL HIGH (ref 70–99)
Glucose-Capillary: 169 mg/dL — ABNORMAL HIGH (ref 70–99)
Glucose-Capillary: 173 mg/dL — ABNORMAL HIGH (ref 70–99)
Glucose-Capillary: 221 mg/dL — ABNORMAL HIGH (ref 70–99)

## 2014-03-25 LAB — HEPATIC FUNCTION PANEL
ALT: 108 U/L — ABNORMAL HIGH (ref 0–53)
AST: 49 U/L — ABNORMAL HIGH (ref 0–37)
Albumin: 2.7 g/dL — ABNORMAL LOW (ref 3.5–5.2)
Alkaline Phosphatase: 89 U/L (ref 39–117)
BILIRUBIN INDIRECT: 1 mg/dL — AB (ref 0.3–0.9)
Bilirubin, Direct: 1.1 mg/dL — ABNORMAL HIGH (ref 0.0–0.3)
TOTAL PROTEIN: 6.3 g/dL (ref 6.0–8.3)
Total Bilirubin: 2.1 mg/dL — ABNORMAL HIGH (ref 0.3–1.2)

## 2014-03-25 LAB — URINE CULTURE
Colony Count: NO GROWTH
Culture: NO GROWTH

## 2014-03-25 SURGERY — LAPAROSCOPIC CHOLECYSTECTOMY
Anesthesia: General | Site: Abdomen

## 2014-03-25 MED ORDER — HYDROMORPHONE HCL PF 1 MG/ML IJ SOLN
0.2500 mg | INTRAMUSCULAR | Status: DC | PRN
  Administered 2014-03-25: 0.5 mg via INTRAVENOUS

## 2014-03-25 MED ORDER — FENTANYL CITRATE 0.05 MG/ML IJ SOLN
INTRAMUSCULAR | Status: AC
  Filled 2014-03-25: qty 5

## 2014-03-25 MED ORDER — OXYCODONE HCL 5 MG PO TABS: 5.0000 mg | ORAL_TABLET | Freq: Once | ORAL | Status: DC | PRN

## 2014-03-25 MED ORDER — BUPIVACAINE-EPINEPHRINE (PF) 0.25% -1:200000 IJ SOLN
INTRAMUSCULAR | Status: AC
  Filled 2014-03-25: qty 30

## 2014-03-25 MED ORDER — HYDROMORPHONE HCL PF 1 MG/ML IJ SOLN
INTRAMUSCULAR | Status: AC
  Filled 2014-03-25: qty 1

## 2014-03-25 MED ORDER — OXYCODONE HCL 5 MG PO TABS
5.0000 mg | ORAL_TABLET | Freq: Four times a day (QID) | ORAL | Status: DC | PRN
  Administered 2014-03-26: 5 mg via ORAL
  Filled 2014-03-25: qty 1

## 2014-03-25 MED ORDER — SODIUM CHLORIDE 0.9 % IR SOLN
Status: DC | PRN
  Administered 2014-03-25: 1000 mL

## 2014-03-25 MED ORDER — HEMOSTATIC AGENTS (NO CHARGE) OPTIME
TOPICAL | Status: DC | PRN
  Administered 2014-03-25 (×2): 1 via TOPICAL

## 2014-03-25 MED ORDER — LACTATED RINGERS IV SOLN
INTRAVENOUS | Status: DC
  Administered 2014-03-25: 12:00:00 via INTRAVENOUS
  Administered 2014-03-25: 50 mL/h via INTRAVENOUS

## 2014-03-25 MED ORDER — MIDAZOLAM HCL 2 MG/2ML IJ SOLN
INTRAMUSCULAR | Status: AC
  Filled 2014-03-25: qty 2

## 2014-03-25 MED ORDER — 0.9 % SODIUM CHLORIDE (POUR BTL) OPTIME
TOPICAL | Status: DC | PRN
  Administered 2014-03-25: 1000 mL

## 2014-03-25 MED ORDER — PROPOFOL 10 MG/ML IV BOLUS
INTRAVENOUS | Status: AC
  Filled 2014-03-25: qty 20

## 2014-03-25 MED ORDER — OXYCODONE HCL 5 MG/5ML PO SOLN: 5.0000 mg | Freq: Once | ORAL | Status: DC | PRN

## 2014-03-25 MED ORDER — MIDAZOLAM HCL 5 MG/5ML IJ SOLN
INTRAMUSCULAR | Status: DC | PRN
  Administered 2014-03-25: 2 mg via INTRAVENOUS

## 2014-03-25 MED ORDER — MORPHINE SULFATE 2 MG/ML IJ SOLN
1.0000 mg | INTRAMUSCULAR | Status: DC | PRN
  Administered 2014-03-25: 2 mg via INTRAVENOUS
  Filled 2014-03-25: qty 1

## 2014-03-25 MED ORDER — BUPIVACAINE-EPINEPHRINE 0.25% -1:200000 IJ SOLN
INTRAMUSCULAR | Status: DC | PRN
  Administered 2014-03-25: 20 mL

## 2014-03-25 MED ORDER — LIDOCAINE HCL (CARDIAC) 20 MG/ML IV SOLN
INTRAVENOUS | Status: DC | PRN
  Administered 2014-03-25: 40 mg via INTRAVENOUS

## 2014-03-25 MED ORDER — PROPOFOL 10 MG/ML IV BOLUS
INTRAVENOUS | Status: DC | PRN
  Administered 2014-03-25: 20 mg via INTRAVENOUS
  Administered 2014-03-25: 120 mg via INTRAVENOUS

## 2014-03-25 MED ORDER — FENTANYL CITRATE 0.05 MG/ML IJ SOLN
INTRAMUSCULAR | Status: DC | PRN
  Administered 2014-03-25 (×2): 50 ug via INTRAVENOUS
  Administered 2014-03-25: 100 ug via INTRAVENOUS
  Administered 2014-03-25: 50 ug via INTRAVENOUS

## 2014-03-25 MED ORDER — ONDANSETRON HCL 4 MG/2ML IJ SOLN
INTRAMUSCULAR | Status: DC | PRN
  Administered 2014-03-25: 4 mg via INTRAVENOUS

## 2014-03-25 MED ORDER — SUCCINYLCHOLINE CHLORIDE 20 MG/ML IJ SOLN
INTRAMUSCULAR | Status: DC | PRN
  Administered 2014-03-25 (×2): 100 mg via INTRAVENOUS

## 2014-03-25 SURGICAL SUPPLY — 36 items
APPLIER CLIP 5 13 M/L LIGAMAX5 (MISCELLANEOUS) ×3
BANDAGE ADH SHEER 1  50/CT (GAUZE/BANDAGES/DRESSINGS) ×12 IMPLANT
BENZOIN TINCTURE PRP APPL 2/3 (GAUZE/BANDAGES/DRESSINGS) ×3 IMPLANT
CANISTER SUCTION 2500CC (MISCELLANEOUS) ×3 IMPLANT
CHLORAPREP W/TINT 26ML (MISCELLANEOUS) ×3 IMPLANT
CLIP APPLIE 5 13 M/L LIGAMAX5 (MISCELLANEOUS) ×2 IMPLANT
COVER MAYO STAND STRL (DRAPES) IMPLANT
COVER SURGICAL LIGHT HANDLE (MISCELLANEOUS) ×3 IMPLANT
DECANTER SPIKE VIAL GLASS SM (MISCELLANEOUS) ×3 IMPLANT
DRAPE C-ARM 42X72 X-RAY (DRAPES) IMPLANT
DRAPE UTILITY 15X26 W/TAPE STR (DRAPE) ×6 IMPLANT
ELECT REM PT RETURN 9FT ADLT (ELECTROSURGICAL) ×3
ELECTRODE REM PT RTRN 9FT ADLT (ELECTROSURGICAL) ×2 IMPLANT
GLOVE SURG SIGNA 7.5 PF LTX (GLOVE) ×3 IMPLANT
GOWN STRL REUS W/ TWL LRG LVL3 (GOWN DISPOSABLE) ×6 IMPLANT
GOWN STRL REUS W/ TWL XL LVL3 (GOWN DISPOSABLE) ×2 IMPLANT
GOWN STRL REUS W/TWL LRG LVL3 (GOWN DISPOSABLE) ×3
GOWN STRL REUS W/TWL XL LVL3 (GOWN DISPOSABLE) ×1
HEMOSTAT SNOW SURGICEL 2X4 (HEMOSTASIS) ×3 IMPLANT
KIT BASIN OR (CUSTOM PROCEDURE TRAY) ×3 IMPLANT
KIT ROOM TURNOVER OR (KITS) ×3 IMPLANT
NS IRRIG 1000ML POUR BTL (IV SOLUTION) ×3 IMPLANT
PAD ARMBOARD 7.5X6 YLW CONV (MISCELLANEOUS) ×3 IMPLANT
POUCH SPECIMEN RETRIEVAL 10MM (ENDOMECHANICALS) ×3 IMPLANT
SCISSORS LAP 5X35 DISP (ENDOMECHANICALS) ×3 IMPLANT
SET CHOLANGIOGRAPH 5 50 .035 (SET/KITS/TRAYS/PACK) IMPLANT
SET IRRIG TUBING LAPAROSCOPIC (IRRIGATION / IRRIGATOR) ×3 IMPLANT
SLEEVE ENDOPATH XCEL 5M (ENDOMECHANICALS) ×6 IMPLANT
SPECIMEN JAR SMALL (MISCELLANEOUS) ×3 IMPLANT
STRIP CLOSURE SKIN 1/2X4 (GAUZE/BANDAGES/DRESSINGS) ×3 IMPLANT
SUT MON AB 4-0 PC3 18 (SUTURE) ×3 IMPLANT
TOWEL OR 17X24 6PK STRL BLUE (TOWEL DISPOSABLE) ×3 IMPLANT
TOWEL OR 17X26 10 PK STRL BLUE (TOWEL DISPOSABLE) ×3 IMPLANT
TRAY LAPAROSCOPIC (CUSTOM PROCEDURE TRAY) ×3 IMPLANT
TROCAR XCEL BLUNT TIP 100MML (ENDOMECHANICALS) ×3 IMPLANT
TROCAR XCEL NON-BLD 5MMX100MML (ENDOMECHANICALS) ×3 IMPLANT

## 2014-03-25 NOTE — Anesthesia Postprocedure Evaluation (Signed)
  Anesthesia Post-op Note  Patient: Christopher Arnold  Procedure(s) Performed: Procedure(s): LAPAROSCOPIC CHOLECYSTECTOMY (N/A)  Patient Location: PACU  Anesthesia Type:General  Level of Consciousness: awake, alert , oriented and patient cooperative  Airway and Oxygen Therapy: Patient Spontanous Breathing  Post-op Pain: mild  Post-op Assessment: Post-op Vital signs reviewed, Patient's Cardiovascular Status Stable, Respiratory Function Stable, Patent Airway, No signs of Nausea or vomiting and Pain level controlled  Post-op Vital Signs: stable  Last Vitals:  Filed Vitals:   03/25/14 1330  BP: 119/63  Pulse: 74  Temp: 36.8 C  Resp: 21    Complications: No apparent anesthesia complications

## 2014-03-25 NOTE — Progress Notes (Signed)
Operative findings noted.  No IOC done.  Will repeat LFTs in am.

## 2014-03-25 NOTE — Progress Notes (Signed)
[facility] PROGRESS NOTE  Jeryl Umholtz LDJ:[id number] DOB: 07-14-1946 DOA: 03/22/2014 PCP: No primary provider on file.  Assessment/Plan: 1. Right upper quadrant abdominal pain, fever, leukocytosis and abnormal US showing gall bladder sludge: - CT abd and pelvis ordered to further evaluate cholecystitis. Findings reported gallbladder is distended with stranding about it worrisome for cholecystitis.  -General surgery consulted and patient status post laparoscopic cholecystectomy - empirically started him on IV zosyn.  - blood cultures ordered and pending.  - Reassess CBC next a.m.   2. Mild duodenitis: On PPI and zosyn Reassess CBC as mentioned above  3. CHEST PAIN: S/p cath with no CAD, and normal LV function. Cardiology has signed off.   4. Diabetes Mellitus:  hgba1c is 7.4 SSI and diabetic diet when taking po  dvt prophylaxis.   Code Status: full code.  Family Communication: discussed with patient. Disposition Plan: transfer to telemetry.    Consultants:  GI  General surgery  Procedures:  US abdomen  CT of abd and pelvis  Laparoscopic cholecystectomy  Antibiotics:  Zosyn  HPI/Subjective: Intermittent abdominal and chest pain. Pain tolerable on current regimen.  Objective: Filed Vitals:   03/25/14 1755  BP: 129/80  Pulse: 82  Temp:   Resp:     Intake/Output Summary (Last 24 hours) at 03/25/14 1812 Last data filed at 03/25/14 1810  Gross per 24 hour  Intake   1000 ml  Output    900 ml  Net    100 ml   Filed Weights   03/22/14 1300 03/24/14 0506 03/25/14 0528  Weight: 86.7 kg (191 lb 2.2 oz) 84.959 kg (187 lb 4.8 oz) 84.5 kg (186 lb 4.6 oz)    Exam:   General:  Alert afebrile comfortable  Cardiovascular: s1 s2, no murmurs  Respiratory: ctab, no wheezes  Abdomen: soft ND  Musculoskeletal: no pedal edema.   Data Reviewed: Basic Metabolic Panel:  Recent Labs Lab 03/22/14 0942 03/22/14 1305 03/23/14 0510  NA 137 140  138  K 4.1 4.2 3.7  CL 99 100 99  CO2 23 24 24   GLUCOSE 286* 234* 154*  BUN 20 17 18   CREATININE 0.77 0.63 0.80  CALCIUM 9.8 9.1 8.2*   Liver Function Tests:  Recent Labs Lab 03/22/14 1305 03/25/14 0533  AST 35 49*  ALT 37 108*  ALKPHOS 69 89  BILITOT 0.4 2.1*  PROT 7.0 6.3  ALBUMIN 3.9 2.7*    Recent Labs Lab 03/22/14 1305  LIPASE 17  AMYLASE 25   No results found for this basename: AMMONIA,  in the last 168 hours CBC:  Recent Labs Lab 03/22/14 0942 03/22/14 1305 03/23/14 0510 03/23/14 1544  WBC 13.4* 13.9* 12.0* 12.8*  HGB 14.8 14.2 12.8* 14.7  HCT 43.4 41.8 38.9* 44.3  MCV 87.9 88.4 88.4 91.7  PLT 175 173 155 154   Cardiac Enzymes:  Recent Labs Lab 03/22/14 0955 03/22/14 1305 03/22/14 1900 03/22/14 2245  TROPONINI <0.30 <0.30 <0.30 <0.30   BNP (last 3 results) No results found for this basename: PROBNP,  in the last 8760 hours CBG:  Recent Labs Lab 03/25/14 0734 03/25/14 1107 03/25/14 1238 03/25/14 1353 03/25/14 1644  GLUCAP 166* 141* 151* 169* 173*    Recent Results (from the past 240 hour(s))  MRSA PCR SCREENING     Status: None   Collection Time    03/22/14 12:13 PM      Result Value Ref Range Status   MRSA by PCR NEGATIVE  NEGATIVE Final   Comment:  The GeneXpert MRSA Assay (FDA     approved for NASAL specimens     only), is one component of a     comprehensive MRSA colonization     surveillance program. It is not     intended to diagnose MRSA     infection nor to guide or     monitor treatment for     MRSA infections.  CULTURE, BLOOD (ROUTINE X 2)     Status: None   Collection Time    03/23/14  7:10 PM      Result Value Ref Range Status   Specimen Description BLOOD RIGHT ARM   Final   Special Requests BOTTLES DRAWN AEROBIC AND ANAEROBIC 5CC EACH   Final   Culture  Setup Time     Final   Value: 03/24/2014 02:29     Performed at [facility]   Culture     Final   Value:        BLOOD CULTURE RECEIVED  NO GROWTH TO DATE CULTURE WILL BE HELD FOR 5 DAYS BEFORE ISSUING A FINAL NEGATIVE REPORT     Performed at [facility]   Report Status PENDING   Incomplete  CULTURE, BLOOD (ROUTINE X 2)     Status: None   Collection Time    03/23/14  7:20 PM      Result Value Ref Range Status   Specimen Description BLOOD BLOOD RIGHT FOREARM   Final   Special Requests BOTTLES DRAWN AEROBIC AND ANAEROBIC 5CC EACH   Final   Culture  Setup Time     Final   Value: 03/24/2014 02:29     Performed at [facility]   Culture     Final   Value:        BLOOD CULTURE RECEIVED NO GROWTH TO DATE CULTURE WILL BE HELD FOR 5 DAYS BEFORE ISSUING A FINAL NEGATIVE REPORT     Performed at [facility]   Report Status PENDING   Incomplete  URINE CULTURE     Status: None   Collection Time    03/24/14  2:42 AM      Result Value Ref Range Status   Specimen Description URINE, RANDOM   Final   Special Requests NONE   Final   Culture  Setup Time     Final   Value: 03/24/2014 09:11     Performed at [facility] Count     Final   Value: NO GROWTH     Performed at [facility]   Culture     Final   Value: NO GROWTH     Performed at [facility]   Report Status 03/25/2014 FINAL   Final     Studies: No results found.  Scheduled Meds: . heparin  5,000 Units Subcutaneous 3 times per day  . HYDROmorphone      . insulin aspart  0-15 Units Subcutaneous TID WC  . pantoprazole  40 mg Oral Daily  . piperacillin-tazobactam (ZOSYN)  IV  3.375 g Intravenous Q8H   Continuous Infusions: . sodium chloride 1 mL/kg/hr (03/25/14 1418)  . lactated ringers Stopped (03/25/14 1418)    Active Problems:   Anginal chest pain at rest   Type 2 diabetes mellitus   Chest pain at rest   Fever, unspecified   Leukocytosis, unspecified   Cholecystitis, acute    Time spent: 35 minutes    Velvet Bathe  [facility] Pager [phone]. If  7PM-7AM, please contact night-coverage  at [email or web address], password [facility] 03/25/2014, 6:12 PM  LOS: 3 days

## 2014-03-25 NOTE — Progress Notes (Signed)
Pt is refusing clear liquid diet and states his wife will bring him something tonight that he will be able to eat.  Will continue to monitor.

## 2014-03-25 NOTE — Anesthesia Preprocedure Evaluation (Addendum)
Anesthesia Evaluation  Patient identified by MRN, date of birth, ID band Patient awake    Reviewed: Allergy & Precautions, H&P , NPO status , Patient's Chart, lab work & pertinent test results  Airway Mallampati: II TM Distance: >3 FB Neck ROM: Full    Dental no notable dental hx. (+) Teeth Intact, Dental Advisory Given   Pulmonary neg pulmonary ROS, former smoker,  breath sounds clear to auscultation  Pulmonary exam normal       Cardiovascular negative cardio ROS  Rhythm:Regular Rate:Normal     Neuro/Psych negative neurological ROS  negative psych ROS   GI/Hepatic negative GI ROS, Neg liver ROS,   Endo/Other  diabetes, Well Controlled, Type 2, Oral Hypoglycemic Agents  Renal/GU   negative genitourinary   Musculoskeletal   Abdominal   Peds  Hematology negative hematology ROS (+)   Anesthesia Other Findings   Reproductive/Obstetrics negative OB ROS                          Anesthesia Physical Anesthesia Plan  ASA: II  Anesthesia Plan: General   Post-op Pain Management:    Induction: Intravenous  Airway Management Planned: Oral ETT  Additional Equipment:   Intra-op Plan:   Post-operative Plan: Extubation in OR  Informed Consent: I have reviewed the patients History and Physical, chart, labs and discussed the procedure including the risks, benefits and alternatives for the proposed anesthesia with the patient or authorized representative who has indicated his/her understanding and acceptance.   Dental advisory given  Plan Discussed with: CRNA  Anesthesia Plan Comments:         Anesthesia Quick Evaluation

## 2014-03-25 NOTE — Op Note (Signed)
Laparoscopic Cholecystectomy Procedure Note  Indications: This patient presents with symptomatic gallbladder disease and will undergo laparoscopic cholecystectomy.  Pre-operative Diagnosis: Calculus of gallbladder with acute cholecystitis, without mention of obstruction  Post-operative Diagnosis: Same  Surgeon: Coralie Keens A   Assistants: 0  Anesthesia: General endotracheal anesthesia  ASA Class: 3  Procedure Details  The patient was seen again in the Holding Room. The risks, benefits, complications, treatment options, and expected outcomes were discussed with the patient. The possibilities of reaction to medication, pulmonary aspiration, perforation of viscus, bleeding, recurrent infection, finding a normal gallbladder, the need for additional procedures, failure to diagnose a condition, the possible need to convert to an open procedure, and creating a complication requiring transfusion or operation were discussed with the patient. The likelihood of improving the patient's symptoms with return to their baseline status is good.  The patient and/or family concurred with the proposed plan, giving informed consent. The site of surgery properly noted. The patient was taken to Operating Room, identified as [facility] and the procedure verified as Laparoscopic Cholecystectomy with Intraoperative Cholangiogram. A Time Out was held and the above information confirmed.  Prior to the induction of general anesthesia, antibiotic prophylaxis was administered. General endotracheal anesthesia was then administered and tolerated well. After the induction, the abdomen was prepped with Chloraprep and draped in sterile fashion. The patient was positioned in the supine position.  Local anesthetic agent was injected into the skin near the umbilicus and an incision made. We dissected down to the abdominal fascia with blunt dissection.  The fascia was incised vertically and we entered the peritoneal cavity  bluntly.  A pursestring suture of 0-Vicryl was placed around the fascial opening.  The Hasson cannula was inserted and secured with the stay suture.  Pneumoperitoneum was then created with CO2 and tolerated well without any adverse changes in the patient's vital signs. An 11-mm port was placed in the subxiphoid position.  Two 5-mm ports were placed in the right upper quadrant. All skin incisions were infiltrated with a local anesthetic agent before making the incision and placing the trocars.   We positioned the patient in reverse Trendelenburg, tilted slightly to the patient's left.  The gallbladder was identified, the fundus grasped and retracted cephalad. It was acutely inflammed and distended.  Bile had to be aspirated from the gallbladder in order to grasp it.  Adhesions were lysed bluntly and with the electrocautery where indicated, taking care not to injure any adjacent organs or viscus. The infundibulum was grasped and retracted laterally, exposing the peritoneum overlying the triangle of Calot. This was then divided and exposed in a blunt fashion. The cystic duct was clearly identified and bluntly dissected circumferentially. A critical view of the cystic duct and cystic artery was obtained.  The cystic duct was then ligated with clips and divided. The cystic artery was, dissected free, ligated with clips and divided as well.   The gallbladder was dissected from the liver bed in retrograde fashion with the electrocautery. The gallbladder was removed and placed in an Endocatch sac. The liver bed was irrigated and inspected. Hemostasis was achieved with the electrocautery and surgical Snow x 2. Copious irrigation was utilized and was repeatedly aspirated until clear.  The gallbladder and Endocatch sac were then removed through the umbilical port site.  The pursestring suture was used to close the umbilical fascia.    We again inspected the right upper quadrant for hemostasis.  Pneumoperitoneum was  released as we removed the trocars.  4-0 Monocryl  was used to close the skin.   Benzoin, steri-strips, and clean dressings were applied. The patient was then extubated and brought to the recovery room in stable condition. Instrument, sponge, and needle counts were correct at closure and at the conclusion of the case.   Findings: Cholecystitis with Cholelithiasis  Estimated Blood Loss: Minimal         Drains: 0         Specimens: Gallbladder           Complications: None; patient tolerated the procedure well.         Disposition: PACU - hemodynamically stable.         Condition: stable

## 2014-03-25 NOTE — Anesthesia Procedure Notes (Signed)
Procedure Name: Intubation Date/Time: 03/25/2014 11:31 AM Performed by: Manuela Schwartz B Pre-anesthesia Checklist: Patient identified, Emergency Drugs available, Suction available, Patient being monitored and Timeout performed Patient Re-evaluated:Patient Re-evaluated prior to inductionOxygen Delivery Method: Circle system utilized Preoxygenation: Pre-oxygenation with 100% oxygen Intubation Type: IV induction and Rapid sequence Laryngoscope Size: Mac and 3 Grade View: Grade I Tube type: Oral Tube size: 7.5 mm Number of attempts: 1 Airway Equipment and Method: Stylet Placement Confirmation: ETT inserted through vocal cords under direct vision,  positive ETCO2 and breath sounds checked- equal and bilateral Secured at: 23 cm Tube secured with: Tape Dental Injury: Teeth and Oropharynx as per pre-operative assessment

## 2014-03-25 NOTE — Transfer of Care (Signed)
Immediate Anesthesia Transfer of Care Note  Patient: Christopher Arnold  Procedure(s) Performed: Procedure(s): LAPAROSCOPIC CHOLECYSTECTOMY (N/A)  Patient Location: PACU  Anesthesia Type:General  Level of Consciousness: awake, alert  and oriented  Airway & Oxygen Therapy: Patient Spontanous Breathing  Post-op Assessment: Report given to PACU RN and Post -op Vital signs reviewed and stable  Post vital signs: Reviewed and stable  Complications: No apparent anesthesia complications

## 2014-03-26 DIAGNOSIS — I2119 ST elevation (STEMI) myocardial infarction involving other coronary artery of inferior wall: Secondary | ICD-10-CM | POA: Diagnosis not present

## 2014-03-26 LAB — CBC
HEMATOCRIT: 36.7 % — AB (ref 39.0–52.0)
Hemoglobin: 12.2 g/dL — ABNORMAL LOW (ref 13.0–17.0)
MCH: 30 pg (ref 26.0–34.0)
MCHC: 33.2 g/dL (ref 30.0–36.0)
MCV: 90.4 fL (ref 78.0–100.0)
PLATELETS: 144 10*3/uL — AB (ref 150–400)
RBC: 4.06 MIL/uL — ABNORMAL LOW (ref 4.22–5.81)
RDW: 13 % (ref 11.5–15.5)
WBC: 10.2 10*3/uL (ref 4.0–10.5)

## 2014-03-26 LAB — HEPATIC FUNCTION PANEL
ALK PHOS: 96 U/L (ref 39–117)
ALT: 100 U/L — ABNORMAL HIGH (ref 0–53)
AST: 65 U/L — ABNORMAL HIGH (ref 0–37)
Albumin: 2.4 g/dL — ABNORMAL LOW (ref 3.5–5.2)
BILIRUBIN INDIRECT: 0.9 mg/dL (ref 0.3–0.9)
BILIRUBIN TOTAL: 1.8 mg/dL — AB (ref 0.3–1.2)
Bilirubin, Direct: 0.9 mg/dL — ABNORMAL HIGH (ref 0.0–0.3)
Total Protein: 6 g/dL (ref 6.0–8.3)

## 2014-03-26 LAB — GLUCOSE, CAPILLARY
GLUCOSE-CAPILLARY: 139 mg/dL — AB (ref 70–99)
Glucose-Capillary: 159 mg/dL — ABNORMAL HIGH (ref 70–99)

## 2014-03-26 MED ORDER — PANTOPRAZOLE SODIUM 40 MG PO TBEC: 40.0000 mg | DELAYED_RELEASE_TABLET | Freq: Every day | ORAL | Status: DC

## 2014-03-26 MED ORDER — OXYCODONE HCL 5 MG PO TABS: 5.0000 mg | ORAL_TABLET | Freq: Four times a day (QID) | ORAL | Status: DC | PRN

## 2014-03-26 NOTE — Progress Notes (Signed)
Patient's family brought him some soup for dinner tonight since patient refused to eat the clear liquid diet.  Patient tolerated soup well.  Changed diet order to full liquid.  Will continue to monitor.

## 2014-03-26 NOTE — Progress Notes (Signed)
CARE MANAGEMENT NOTE 03/26/2014  Patient:  Felkins,Krosby   Account Number:  [id number]  Date Initiated:  03/24/2014  Documentation initiated by:  GRAVES-BIGELOW,Loreena Valeri  Subjective/Objective Assessment:   Pt admitted for cp @ rest. S/p cath and now GI following up for increased abdominal pain.     Action/Plan:   CM will continue to monitor for disposition needs.   Anticipated DC Date:  03/26/2014   Anticipated DC Plan:  [place]  CM consult      Choice offered to / List presented to:             Status of service:  Completed, signed off Medicare Important Message given?  YES (If response is "NO", the following Medicare IM given date fields will be blank) Date Medicare IM given:  03/24/2014 Medicare IM given by:  GRAVES-BIGELOW,Lujuana Kapler Date Additional Medicare IM given:   Additional Medicare IM given by:    Discharge Disposition:  HOME/SELF CARE  Per UR Regulation:  Reviewed for med. necessity/level of care/duration of stay  If discussed at [place] of Stay Meetings, dates discussed:    Comments:

## 2014-03-26 NOTE — Progress Notes (Signed)
Patient ID: Christopher Arnold, male   DOB: 1945/09/24, 68 y.o.   MRN: [id number]  Subjective: Febrile at 103 last night.  No tachy.  WBC normal.  LFTs trending down.  Ate what his family brought in.  Says hes on a special diabetic diet and does not want to eat anything here.  I offered to change to regular diet, however, he still declined.  Passing flatus.  Up in room.  Voiding.  VSS.    Objective:  Vital signs:  Filed Vitals:   03/25/14 1852 03/25/14 2113 03/25/14 2234 03/26/14 0610  BP: 124/75 122/61  111/64  Pulse: 87 93 94 78  Temp: 100.1 F (37.8 C) 103 F (39.4 C) 100.1 F (37.8 C) 99.2 F (37.3 C)  TempSrc: Oral Oral Oral Oral  Resp:  18  18  Height:      Weight:    187 lb 6.3 oz (85 kg)  SpO2: 92% 93%  92%    Last BM Date: 03/25/14  Intake/Output   Yesterday:  07/14 0701 - 07/15 0700 In: 2470 [I.V.:2120; IV Piggyback:350] Out: 850 [Urine:850] This shift:    I/O last 3 completed shifts: In: 2470 [I.V.:2120; IV Piggyback:350] Out: 1150 [Urine:1150]    Physical Exam: General: Pt awake/alert/oriented x4 in no acute distress Abdomen: Soft.  Nondistended. Appropriately tender.  Incisions are c/d/i.  No evidence of peritonitis.  No incarcerated hernias.   Problem List:   Active Problems:   Anginal chest pain at rest   Type 2 diabetes mellitus   Chest pain at rest   Fever, unspecified   Leukocytosis, unspecified   Cholecystitis, acute    Results:   Labs: Results for orders placed during the hospital encounter of 03/22/14 (from the past 48 hour(s))  GLUCOSE, CAPILLARY     Status: Abnormal   Collection Time    03/24/14 11:27 AM      Result Value Ref Range   Glucose-Capillary 150 (*) 70 - 99 mg/dL   Comment 1 Notify RN    GLUCOSE, CAPILLARY     Status: Abnormal   Collection Time    03/24/14  5:10 PM      Result Value Ref Range   Glucose-Capillary 191 (*) 70 - 99 mg/dL   Comment 1 Notify RN    GLUCOSE, CAPILLARY     Status: Abnormal   Collection Time     03/24/14  9:03 PM      Result Value Ref Range   Glucose-Capillary 228 (*) 70 - 99 mg/dL  HEPATIC FUNCTION PANEL     Status: Abnormal   Collection Time    03/25/14  5:33 AM      Result Value Ref Range   Total Protein 6.3  6.0 - 8.3 g/dL   Albumin 2.7 (*) 3.5 - 5.2 g/dL   AST 49 (*) 0 - 37 U/L   ALT 108 (*) 0 - 53 U/L   Alkaline Phosphatase 89  39 - 117 U/L   Total Bilirubin 2.1 (*) 0.3 - 1.2 mg/dL   Bilirubin, Direct 1.1 (*) 0.0 - 0.3 mg/dL   Indirect Bilirubin 1.0 (*) 0.3 - 0.9 mg/dL  GLUCOSE, CAPILLARY     Status: Abnormal   Collection Time    03/25/14  7:34 AM      Result Value Ref Range   Glucose-Capillary 166 (*) 70 - 99 mg/dL  GLUCOSE, CAPILLARY     Status: Abnormal   Collection Time    03/25/14 11:07 AM      Result  Value Ref Range   Glucose-Capillary 141 (*) 70 - 99 mg/dL  GLUCOSE, CAPILLARY     Status: Abnormal   Collection Time    03/25/14 12:38 PM      Result Value Ref Range   Glucose-Capillary 151 (*) 70 - 99 mg/dL   Comment 1 Documented in Chart     Comment 2 Notify RN    GLUCOSE, CAPILLARY     Status: Abnormal   Collection Time    03/25/14  1:53 PM      Result Value Ref Range   Glucose-Capillary 169 (*) 70 - 99 mg/dL  GLUCOSE, CAPILLARY     Status: Abnormal   Collection Time    03/25/14  4:44 PM      Result Value Ref Range   Glucose-Capillary 173 (*) 70 - 99 mg/dL  GLUCOSE, CAPILLARY     Status: Abnormal   Collection Time    03/25/14  9:11 PM      Result Value Ref Range   Glucose-Capillary 221 (*) 70 - 99 mg/dL  HEPATIC FUNCTION PANEL     Status: Abnormal   Collection Time    03/26/14  1:14 AM      Result Value Ref Range   Total Protein 6.0  6.0 - 8.3 g/dL   Albumin 2.4 (*) 3.5 - 5.2 g/dL   AST 65 (*) 0 - 37 U/L   ALT 100 (*) 0 - 53 U/L   Alkaline Phosphatase 96  39 - 117 U/L   Total Bilirubin 1.8 (*) 0.3 - 1.2 mg/dL   Bilirubin, Direct 0.9 (*) 0.0 - 0.3 mg/dL   Indirect Bilirubin 0.9  0.3 - 0.9 mg/dL  CBC     Status: Abnormal   Collection  Time    03/26/14  1:14 AM      Result Value Ref Range   WBC 10.2  4.0 - 10.5 K/uL   RBC 4.06 (*) 4.22 - 5.81 MIL/uL   Hemoglobin 12.2 (*) 13.0 - 17.0 g/dL   HCT 36.7 (*) 39.0 - 52.0 %   MCV 90.4  78.0 - 100.0 fL   MCH 30.0  26.0 - 34.0 pg   MCHC 33.2  30.0 - 36.0 g/dL   RDW 13.0  11.5 - 15.5 %   Platelets 144 (*) 150 - 400 K/uL  GLUCOSE, CAPILLARY     Status: Abnormal   Collection Time    03/26/14  7:44 AM      Result Value Ref Range   Glucose-Capillary 159 (*) 70 - 99 mg/dL    Imaging / Studies: No results found.  Scheduled Meds: . heparin  5,000 Units Subcutaneous 3 times per day  . insulin aspart  0-15 Units Subcutaneous TID WC  . pantoprazole  40 mg Oral Daily  . piperacillin-tazobactam (ZOSYN)  IV  3.375 g Intravenous Q8H   Continuous Infusions: . sodium chloride 1 mL/kg/hr (03/25/14 1418)  . lactated ringers Stopped (03/25/14 1418)   PRN Meds:.morphine injection, nitroGLYCERIN, ondansetron (ZOFRAN) IV, oxyCODONE   Antibiotics: Anti-infectives   Start     Dose/Rate Route Frequency Ordered Stop   03/23/14 2000  piperacillin-tazobactam (ZOSYN) IVPB 3.375 g     3.375 g 12.5 mL/hr over 240 Minutes Intravenous Every 8 hours 03/23/14 1854        Assessment/Plan Acute cholecystitis POD#1 Laparoscopic cholecystectomy  -LFTs are trending down today. He tolerated diet last night, but is refusing to eat any hospital food and wants to go home.  Pain controlled.  Recommended he  stays overnight given his fever last night, however, he declines.  Will let his primary team know. -he will not need antibiotics at discharge -follow up has been arranged, instructions have been provided -will follow Erby Pian, [facility] Surgery Pager [phone] Office [phone]  03/26/2014 8:58 AM

## 2014-03-26 NOTE — Progress Notes (Signed)
I have seen and examined the patient and agree with the assessment and plans.  Napoleon Monacelli A. Ebin Palazzi  MD, FACS  

## 2014-03-26 NOTE — Discharge Instructions (Signed)

## 2014-03-26 NOTE — Progress Notes (Signed)
ANTIBIOTIC CONSULT NOTE - FOLLOW-UP  Pharmacy Consult for Zosyn Indication: possible cholecystitis  Allergies  Allergen Reactions  . Tylenol [Acetaminophen] Rash    Patient Measurements: Height: 5\' 8"  (172.7 cm) Weight: 187 lb 6.3 oz (85 kg) IBW/kg (Calculated) : 68.4  Vital Signs: Temp: 99.2 F (37.3 C) (07/15 0610) Temp src: Oral (07/15 0610) BP: 111/64 mmHg (07/15 0610) Pulse Rate: 78 (07/15 0610) Intake/Output from previous day: 07/14 0701 - 07/15 0700 In: 2470 [I.V.:2120; IV Piggyback:350] Out: 850 [Urine:850] Intake/Output from this shift:    Recent Labs  03/23/14 1544 03/26/14 0114  WBC 12.8* 10.2  HGB 14.7 12.2*  PLT 154 144*   Estimated Creatinine Clearance: 96.4 ml/min (by C-G formula based on Cr of 0.8). No results found for this basename: VANCOTROUGH, Corlis Leak, VANCORANDOM, [place], GENTPEAK, GENTRANDOM, TOBRATROUGH, TOBRAPEAK, TOBRARND, AMIKACINPEAK, AMIKACINTROU, AMIKACIN,  in the last 72 hours   Microbiology: Recent Results (from the past 720 hour(s))  MRSA PCR SCREENING     Status: None   Collection Time    03/22/14 12:13 PM      Result Value Ref Range Status   MRSA by PCR NEGATIVE  NEGATIVE Final   Comment:            The GeneXpert MRSA Assay (FDA     approved for NASAL specimens     only), is one component of a     comprehensive MRSA colonization     surveillance program. It is not     intended to diagnose MRSA     infection nor to guide or     monitor treatment for     MRSA infections.  CULTURE, BLOOD (ROUTINE X 2)     Status: None   Collection Time    03/23/14  7:10 PM      Result Value Ref Range Status   Specimen Description BLOOD RIGHT ARM   Final   Special Requests BOTTLES DRAWN AEROBIC AND ANAEROBIC 5CC EACH   Final   Culture  Setup Time     Final   Value: 03/24/2014 02:29     Performed at [facility]   Culture     Final   Value:        BLOOD CULTURE RECEIVED NO GROWTH TO DATE CULTURE WILL BE HELD FOR 5 DAYS BEFORE  ISSUING A FINAL NEGATIVE REPORT     Performed at [facility]   Report Status PENDING   Incomplete  CULTURE, BLOOD (ROUTINE X 2)     Status: None   Collection Time    03/23/14  7:20 PM      Result Value Ref Range Status   Specimen Description BLOOD BLOOD RIGHT FOREARM   Final   Special Requests BOTTLES DRAWN AEROBIC AND ANAEROBIC 5CC EACH   Final   Culture  Setup Time     Final   Value: 03/24/2014 02:29     Performed at [facility]   Culture     Final   Value:        BLOOD CULTURE RECEIVED NO GROWTH TO DATE CULTURE WILL BE HELD FOR 5 DAYS BEFORE ISSUING A FINAL NEGATIVE REPORT     Performed at [facility]   Report Status PENDING   Incomplete  URINE CULTURE     Status: None   Collection Time    03/24/14  2:42 AM      Result Value Ref Range Status   Specimen Description URINE, RANDOM   Final  Special Requests NONE   Final   Culture  Setup Time     Final   Value: 03/24/2014 09:11     Performed at [place]     Final   Value: NO GROWTH     Performed at [facility]   Culture     Final   Value: NO GROWTH     Performed at [facility]   Report Status 03/25/2014 FINAL   Final    Medical History: Past Medical History  Diagnosis Date  . Diabetes mellitus without complication     Medications:  Prescriptions prior to admission  Medication Sig Dispense Refill  . Linagliptin-Metformin HCl (JENTADUETO) 2.01-999 MG TABS Take 1 tablet by mouth 2 (two) times daily with a meal.      . naproxen (NAPROSYN) 500 MG tablet Take 500 mg by mouth 2 (two) times daily with a meal.       Assessment: 68 y/o male continues on empiric Zosyn (Day #4).  S/p cholecystectomy 03/25/2014. Pt febrile overnight (103F), WBC trending down - now 10.2. Renal fxn is good based on bmet from 03/23/2014. Zosyn dose appropriate based on most recent labs. Will likely D/C abx soon, cultures remain negative, will D/C home w/o abx.  Goal of Therapy:   Eradication of infection  Plan:  - Zosyn 3.375 g IV q8h to be infused over 4 hours - Surgery to clarify D/C of abx - Pharmacy will sign off as no further dose adjustments needed. Please reconsult if needed.  Drucie Opitz, PharmD Clinical Pharmacy Resident Pager: [phone] 03/26/2014 10:40 AM

## 2014-03-26 NOTE — Discharge Summary (Signed)
Physician Discharge Summary  Christopher Arnold BWL:[id number] DOB: 1945-12-18 DOA: 03/22/2014  PCP: No primary provider on file.  Admit date: 03/22/2014 Discharge date: 03/26/2014  Time spent: > 35 minutes  Recommendations for Outpatient Follow-up:  1. Please follow up with pcp in 1 wk. (pt agreeable)  Discharge Diagnoses:  Active Problems:   Anginal chest pain at rest   Type 2 diabetes mellitus   Chest pain at rest   Fever, unspecified   Leukocytosis, unspecified   Cholecystitis, acute   Discharge Condition: stable  Diet recommendation: Low sodium heart healthy  Filed Weights   03/24/14 0506 03/25/14 0528 03/26/14 0610  Weight: 84.959 kg (187 lb 4.8 oz) 84.5 kg (186 lb 4.6 oz) 85 kg (187 lb 6.3 oz)    History of present illness: Hospital Course:  68 y/o Patient who presented with abdominal discomfort and was found to have acute cholecystitis.  1. Right upper quadrant abdominal pain, fever, leukocytosis and abnormal US showing gall bladder sludge: - CT abd and pelvis ordered to further evaluate cholecystitis. Findings reported gallbladder is distended with stranding about it worrisome for cholecystitis.  -General surgery consulted and patient status post laparoscopic cholecystectomy  - empirically started him on IV zosyn.  - blood cultures and urine culture with no growth to date. - cbc within normal limits  2. Mild duodenitis:  Patient placed on PPI  3. CHEST PAIN:  S/p cath with no CAD, and normal LV function. Cardiology has signed off.   4. Diabetes Mellitus:  hgba1c is 7.4  Diabetic diet and patient to continue home regimen  5. Fever - At pod # 1 suspect atelectasis given time frame of fever, normal WBC levels, negative cultures. - Recommended that patient f/u with pcp in 1 wk for further evaluation.  Should he develop any recurrent fevers he is to f/u with his PCP or reevaluated in the ED. - Gen surg and I have recommended patient staying an extra day but patient  wishes to go home. As such we have discussed discharge planning regarding appropriate pcp f/u.   Procedures:  Lap chole  Consultations:  GI  Gen surgery: Dr. Ninfa Linden  Discharge Exam: Filed Vitals:   03/26/14 1342  BP: 108/57  Pulse: 66  Temp: 99 F (37.2 C)  Resp: 18    General: pt in nad, alert and awake Cardiovascular: rrr, no mrg Respiratory: cta bl, no wheezes  Discharge Instructions You were cared for by a hospitalist during your hospital stay. If you have any questions about your discharge medications or the care you received while you were in the hospital after you are discharged, you can call the unit and asked to speak with the hospitalist on call if the hospitalist that took care of you is not available. Once you are discharged, your primary care physician will handle any further medical issues. Please note that NO REFILLS for any discharge medications will be authorized once you are discharged, as it is imperative that you return to your primary care physician (or establish a relationship with a primary care physician if you do not have one) for your aftercare needs so that they can reassess your need for medications and monitor your lab values.      Discharge Instructions   Call MD for:  redness, tenderness, or signs of infection (pain, swelling, redness, odor or green/yellow discharge around incision site)    Complete by:  As directed      Call MD for:  severe uncontrolled pain  Complete by:  As directed      Call MD for:  temperature >100.4    Complete by:  As directed      Diet - low sodium heart healthy    Complete by:  As directed      Increase activity slowly    Complete by:  As directed             Medication List    STOP taking these medications       naproxen 500 MG tablet  Commonly known as:  NAPROSYN      TAKE these medications       JENTADUETO 2.01-999 MG Tabs  Generic drug:  Linagliptin-Metformin HCl  Take 1 tablet by mouth 2  (two) times daily with a meal.     oxyCODONE 5 MG immediate release tablet  Commonly known as:  Oxy IR/ROXICODONE  Take 1 tablet (5 mg total) by mouth every 6 (six) hours as needed for severe pain.     pantoprazole 40 MG tablet  Commonly known as:  PROTONIX  Take 1 tablet (40 mg total) by mouth daily.       Allergies  Allergen Reactions  . Tylenol [Acetaminophen] Rash   Follow-up Information   Follow up with [facility] On 04/22/2014. (arrive by 1:30PM for a 2PM follow up for a post operative check)    Contact information:   [address]   [place] [address] [phone]        The results of significant diagnostics from this hospitalization (including imaging, microbiology, ancillary and laboratory) are listed below for reference.    Significant Diagnostic Studies: US Abdomen Complete  03/22/2014   CLINICAL DATA:  Abdominal pain.  EXAM: ULTRASOUND ABDOMEN COMPLETE  COMPARISON:  CT abdomen and pelvis 12/13/2012.  FINDINGS: Gallbladder:  A few small stones are seen in the gallbladder measuring up to 0.8 cm. There is also some sludge present. No gallbladder wall thickening or pericholecystic fluid. Sonographer reports negative Murphy's sign.  Common bile duct:  Diameter: 0.4 cm.  Liver:  Demonstrates coarsened and increased echogenicity consistent with fatty infiltration. No focal lesion or intrahepatic biliary ductal dilatation.  IVC:  No abnormality visualized.  Pancreas:  Visualized portion unremarkable.  Spleen:  Size and appearance within normal limits.  Right Kidney:  Length: 10.5 cm. Echogenicity within normal limits. No mass or No hydronephrosis visualized.  Left Kidney:  Length: 13.0 cm. Echogenicity within normal limits. 1.1 cm simple cyst off the lower pole noted. No hydronephrosis visualized.  Abdominal aorta:  No aneurysm visualized.  Other findings:  None.  IMPRESSION: Small gallstones and a small amount of gallbladder sludge without evidence of  cholecystitis.  Fatty infiltration of the liver.   Electronically Signed   By: Inge Rise M.D.   On: 03/22/2014 16:21   Ct Abdomen Pelvis W Contrast  03/23/2014   CLINICAL DATA:  Elevated white blood cell count. Epigastric abdominal pain.  EXAM: CT ABDOMEN AND PELVIS WITH CONTRAST  TECHNIQUE: Multidetector CT imaging of the abdomen and pelvis was performed using the standard protocol following bolus administration of intravenous contrast.  CONTRAST:  100 mL OMNIPAQUE IOHEXOL 300 MG/ML  SOLN  COMPARISON:  CT abdomen and pelvis 12/13/2012.  FINDINGS: Trace bilateral pleural effusions are present. There is no pericardial effusion. Heart size is mildly enlarged. Lung bases demonstrate some atelectasis.  The gallbladder appears distended and there is some stranding about the gallbladder. The liver is somewhat  low attenuating compatible with fatty infiltration. No focal liver lesion is seen. The spleen, adrenal glands, pancreas and kidneys are unremarkable with a small amount of perinephric stranding seen. There is aortoiliac atherosclerosis without aneurysm. Fat containing inguinal hernias are seen, larger on the left. The patient has diverticular disease of the colon which appears fairly extensive in the sigmoid but there is no evidence of diverticulitis. The colon is otherwise unremarkable. No lymphadenopathy is identified. No lytic or sclerotic bony lesion is seen.  IMPRESSION: The gallbladder is distended with stranding about it worrisome for cholecystitis. Right upper quadrant ultrasound would be useful for further evaluation.  Fatty infiltration of the liver.  Trace bilateral pleural effusions.  Mild cardiomegaly.  Diverticulosis without diverticulitis.  Small fat containing inguinal hernias, larger on the left.   Electronically Signed   By: Inge Rise M.D.   On: 03/23/2014 19:57   Dg Chest Port 1 View  03/22/2014   CLINICAL DATA:  Chest pain  EXAM: PORTABLE CHEST - 1 VIEW  COMPARISON:  None.   FINDINGS: Cardiac shadow is mildly enlarged. Mild vascular congestion is seen. No focal infiltrate or sizable effusion is noted.  IMPRESSION: Mild CHF.   Electronically Signed   By: Inez Catalina M.D.   On: 03/22/2014 10:09    Microbiology: Recent Results (from the past 240 hour(s))  MRSA PCR SCREENING     Status: None   Collection Time    03/22/14 12:13 PM      Result Value Ref Range Status   MRSA by PCR NEGATIVE  NEGATIVE Final   Comment:            The GeneXpert MRSA Assay (FDA     approved for NASAL specimens     only), is one component of a     comprehensive MRSA colonization     surveillance program. It is not     intended to diagnose MRSA     infection nor to guide or     monitor treatment for     MRSA infections.  CULTURE, BLOOD (ROUTINE X 2)     Status: None   Collection Time    03/23/14  7:10 PM      Result Value Ref Range Status   Specimen Description BLOOD RIGHT ARM   Final   Special Requests BOTTLES DRAWN AEROBIC AND ANAEROBIC 5CC EACH   Final   Culture  Setup Time     Final   Value: 03/24/2014 02:29     Performed at [facility]   Culture     Final   Value:        BLOOD CULTURE RECEIVED NO GROWTH TO DATE CULTURE WILL BE HELD FOR 5 DAYS BEFORE ISSUING A FINAL NEGATIVE REPORT     Performed at [facility]   Report Status PENDING   Incomplete  CULTURE, BLOOD (ROUTINE X 2)     Status: None   Collection Time    03/23/14  7:20 PM      Result Value Ref Range Status   Specimen Description BLOOD BLOOD RIGHT FOREARM   Final   Special Requests BOTTLES DRAWN AEROBIC AND ANAEROBIC 5CC EACH   Final   Culture  Setup Time     Final   Value: 03/24/2014 02:29     Performed at [facility]   Culture     Final   Value:        BLOOD CULTURE RECEIVED NO GROWTH TO DATE CULTURE WILL BE  HELD FOR 5 DAYS BEFORE ISSUING A FINAL NEGATIVE REPORT     Performed at [facility]   Report Status PENDING   Incomplete  URINE CULTURE     Status: None   Collection  Time    03/24/14  2:42 AM      Result Value Ref Range Status   Specimen Description URINE, RANDOM   Final   Special Requests NONE   Final   Culture  Setup Time     Final   Value: 03/24/2014 09:11     Performed at [facility]     Final   Value: NO GROWTH     Performed at [facility]   Culture     Final   Value: NO GROWTH     Performed at [facility]   Report Status 03/25/2014 FINAL   Final     Labs: Basic Metabolic Panel:  Recent Labs Lab 03/22/14 0942 03/22/14 1305 03/23/14 0510  NA 137 140 138  K 4.1 4.2 3.7  CL 99 100 99  CO2 23 24 24   GLUCOSE 286* 234* 154*  BUN 20 17 18   CREATININE 0.77 0.63 0.80  CALCIUM 9.8 9.1 8.2*   Liver Function Tests:  Recent Labs Lab 03/22/14 1305 03/25/14 0533 03/26/14 0114  AST 35 49* 65*  ALT 37 108* 100*  ALKPHOS 69 89 96  BILITOT 0.4 2.1* 1.8*  PROT 7.0 6.3 6.0  ALBUMIN 3.9 2.7* 2.4*    Recent Labs Lab 03/22/14 1305  LIPASE 17  AMYLASE 25   No results found for this basename: AMMONIA,  in the last 168 hours CBC:  Recent Labs Lab 03/22/14 0942 03/22/14 1305 03/23/14 0510 03/23/14 1544 03/26/14 0114  WBC 13.4* 13.9* 12.0* 12.8* 10.2  HGB 14.8 14.2 12.8* 14.7 12.2*  HCT 43.4 41.8 38.9* 44.3 36.7*  MCV 87.9 88.4 88.4 91.7 90.4  PLT 175 173 155 154 144*   Cardiac Enzymes:  Recent Labs Lab 03/22/14 0955 03/22/14 1305 03/22/14 1900 03/22/14 2245  TROPONINI <0.30 <0.30 <0.30 <0.30   BNP: BNP (last 3 results) No results found for this basename: PROBNP,  in the last 8760 hours CBG:  Recent Labs Lab 03/25/14 1353 03/25/14 1644 03/25/14 2111 03/26/14 0744 03/26/14 1119  GLUCAP 169* 173* 221* 159* 139*       Signed:  Velvet Bathe  [facility] 03/26/2014, 2:19 PM

## 2014-03-26 NOTE — Progress Notes (Signed)
Patient ID: Christopher Arnold, male   DOB: 1945/10/25, 68 y.o.   MRN: [id number]  From a surgery standpoint, I think it is ok for discharge today.

## 2014-03-28 ENCOUNTER — Encounter (HOSPITAL_COMMUNITY): Payer: Self-pay | Admitting: Surgery

## 2014-03-30 LAB — CULTURE, BLOOD (ROUTINE X 2)
CULTURE: NO GROWTH
Culture: NO GROWTH

## 2014-04-22 ENCOUNTER — Encounter (INDEPENDENT_AMBULATORY_CARE_PROVIDER_SITE_OTHER): Payer: Self-pay

## 2014-04-22 ENCOUNTER — Ambulatory Visit (INDEPENDENT_AMBULATORY_CARE_PROVIDER_SITE_OTHER): Payer: 59 | Admitting: General Surgery

## 2014-04-22 VITALS — BP 108/58 | HR 64 | Temp 98.0°F | Resp 14 | Ht 66.0 in | Wt 182.4 lb

## 2014-04-22 DIAGNOSIS — M25539 Pain in unspecified wrist: Secondary | ICD-10-CM

## 2014-04-22 DIAGNOSIS — M25531 Pain in right wrist: Secondary | ICD-10-CM

## 2014-04-22 DIAGNOSIS — Z9889 Other specified postprocedural states: Secondary | ICD-10-CM

## 2014-04-22 DIAGNOSIS — Z9049 Acquired absence of other specified parts of digestive tract: Secondary | ICD-10-CM

## 2014-04-22 NOTE — Progress Notes (Signed)
  Subjective: Christopher Arnold is a 68 y.o. male who had a laparoscopic cholecystectomy  on 03/25/14 by Dr. Ninfa Linden  returns to the clinic today.  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  The patient is tolerating their diet well and is having no severe pain.  Bowel function is good.  The pre-operative symptoms of abdominal pain, nausea, and vomiting have resolved.  No problems with the wounds.  Pt is returning to normal activity / work.   Objective: Vital signs in last 24 hours: Reviewed   PE: General:  Alert, NAD, pleasant Abdomen:  soft, NT/ND, +bs, incisions appear well-healed with no sign of infection or bleeding Right wrist:  Normal ROM and appearance, grip strength in wrist flexion/extension and grip strength is 4/5 on right and normal on left.  Elbow and fingers are normal.  Gross sensation intact b/l.   Assessment/Plan  1.  S/P Laparoscopic Cholecystectomy: doing well, may resume regular activity without restrictions, Pt will follow up with [place] PRN and knows to call with questions or concerns.   2.   Wrist pain/numbness/weakness:  Follow up with your PCP in 2 weeks if the symptoms continue.  Discussed stretching, massage, squeeze ball exercises, ice, and NSAIDS.  Not sure how he was positioned but he may have a bruise in that area which is causing him some carpel tunnel symptoms.     Coralie Keens, PA-C 04/22/2014

## 2014-04-22 NOTE — Patient Instructions (Signed)
He may resume a regular diet and full activity.  He may follow-up on a as needed basis.  Follow up with your primary care provider if your wrist pain/numbness/weakness persists beyond 2 more weeks.  Remember to stretch, exercise with a squeeze ball, use ice and Motrin/ibuprofen to help with the pain of your wrist

## 2014-06-18 ENCOUNTER — Encounter: Payer: Self-pay | Admitting: Neurology

## 2014-06-18 ENCOUNTER — Ambulatory Visit (INDEPENDENT_AMBULATORY_CARE_PROVIDER_SITE_OTHER): Payer: 59 | Admitting: Neurology

## 2014-06-18 ENCOUNTER — Encounter (INDEPENDENT_AMBULATORY_CARE_PROVIDER_SITE_OTHER): Payer: Self-pay

## 2014-06-18 VITALS — BP 116/74 | HR 63 | Temp 98.3°F | Ht 67.5 in | Wt 185.0 lb

## 2014-06-18 DIAGNOSIS — G5691 Unspecified mononeuropathy of right upper limb: Secondary | ICD-10-CM

## 2014-06-18 DIAGNOSIS — I208 Other forms of angina pectoris: Secondary | ICD-10-CM

## 2014-06-18 NOTE — Progress Notes (Addendum)
[facility]    Provider:  Dr Jaynee Eagles Referring Provider: Marda Stalker, PA-C Primary Care Physician:  No primary provider on file.  CC:  Hand pain  HPI:  Christopher Arnold is a 68 y.o. male here as a referral from Dr. Rolland Porter for right hand weakness. PMHx uncontrolled diabetes. Pain in the hand with weakness for 2-3 months. No inciting factors. However he types a lot. When he is sleeping feels numb and wakes up. He can't make a fist, weak grip and is dropping objects. Going on since July. Wakes up shaking it out with pain in the wrist and pain in digits 2-5. Tries to massage his hands. Sometimes he has a tremor in that hand. Icing doesn't help nor do OTC pain meds. Denies wearing wrist brace. Pain is worse with typing use. Pain and weakness describes as severe, can't even make a fist Tingling, burning as well. Happens daily, weakness is continuous. Has radiation up the ulnar aspect of his forearm. Also point to the medial ventral wrist and says it hurts in that spot. Has not noticed any atrophy, sometimes wrist is swollen. No FHx of neuromuscular disorders.  Reviewed notes, labs and imaging from outside physicians, which showed right hand weakness, loss of grip strength for 2 months. , pain as well, no injury or inciting events. Started in July, has been using ice, NSAIDs, braces without relief. Has occ elbow pain. Last A1c 7.8 in May 2015.   Review of Systems: Patient complains of symptoms per HPI as well as the following symptoms no fevers/chills, no neck pain, no weight gain. Pertinent negatives per HPI. All others negative.   History   Social History  . Marital Status: Married    Spouse Name: N/A    Number of Children: 3  . Years of Education: Lawyer   Occupational History  . retired Other   Social History Main Topics  . Smoking status: Former Smoker    Quit date: 04/22/2009  . Smokeless tobacco: Never Used  . Alcohol Use: No  . Drug Use: No  . Sexual  Activity: Not on file   Other Topics Concern  . Not on file   Social History Narrative   The patient is married. He is a retired Forensic psychologist. He is originally from [place]. He has a history of tobacco and alcohol use, but no longer smokes and drinks alcohol only in social settings.   Caffeine use: none    History reviewed. No pertinent family history.  Past Medical History  Diagnosis Date  . Diabetes mellitus without complication   . Osteoporosis     Past Surgical History  Procedure Laterality Date  . Appendectomy  remote  . Esophagogastroduodenoscopy N/A 03/23/2014    Procedure: ESOPHAGOGASTRODUODENOSCOPY (EGD);  Surgeon: Juanita Craver, MD;  Location: [facility] ENDOSCOPY;  Service: Endoscopy;  Laterality: N/A;  . Cholecystectomy N/A 03/25/2014    Procedure: LAPAROSCOPIC CHOLECYSTECTOMY;  Surgeon: Harl Bowie, MD;  Location: [place];  Service: General;  Laterality: N/A;    Current Outpatient Prescriptions  Medication Sig Dispense Refill  . Linagliptin-Metformin HCl (JENTADUETO) 2.01-999 MG TABS Take 2 tablets by mouth daily.        No current facility-administered medications for this visit.    Allergies as of 06/18/2014 - Review Complete 06/18/2014  Allergen Reaction Noted  . Tylenol [acetaminophen] Rash 03/25/2014    Vitals: BP 116/74  Pulse 63  Temp(Src) 98.3 F (36.8 C) (Oral)  Ht 5' 7.5" (1.715 m)  Wt 185 lb (  83.915 kg)  BMI 28.53 kg/m2 Last Weight:  Wt Readings from Last 1 Encounters:  06/18/14 185 lb (83.915 kg)   Last Height:   Ht Readings from Last 1 Encounters:  06/18/14 5' 7.5" (1.715 m)    Physical exam: Exam: Gen: NAD, conversant, well nourised, obese, well groomed                     CV: RRR, no MRG. No Carotid Bruits. No peripheral edema, warm, nontender Eyes: Conjunctivae clear without exudates or hemorrhage  Neuro: Detailed Neurologic Exam  Speech:    Speech is normal; fluent and spontaneous with normal comprehension.  Cognition:    The  patient is oriented to person, place, and time;     recent and remote memory intact;     language fluent;     normal attention, concentration,    fund of knowledge Cranial Nerves:    The pupils are equal, round, and reactive to light. The fundi are normal and spontaneous venous pulsations are present. Visual fields are full to finger confrontation. Extraocular movements are intact. Trigeminal sensation is intact and the muscles of mastication are normal. The face is symmetric. The palate elevates in the midline. Voice is normal. Shoulder shrug is normal. The tongue has normal motion without fasciculations.   Coordination:    Normal finger to nose and heel to shin.  Gait:    Heel-toe and tandem gait are normal.   Motor Observation:    No asymmetry, no atrophy, and no involuntary movements noted. Tone:    Normal muscle tone.    Posture:    Posture is normal. normal erect    Strength: Difficult strength testing on right hand, unclear if weakness vs pain or both: APB, Opponens, wrist flexion strong. Cannot make a fist with right hand. FDP (median and ulnar) weak as are the finger extensors, wrist extensor, interossei. Otherwise strength is V/V in the upper and lower limbs. Tinel's sign positive at right wrist and elbow     Sensation: intact to pin prick in the right hand     Reflex Exam:  DTR's:    Deep tendon reflexes in the upper and lower extremities are normal bilaterally.   Toes:    The toes are downgoing bilaterally.   Clonus:    Clonus is absent.   Assessment/Plan:  68 year old male here for evaluation of severe weakness and pain in the right hand for 2-3 months. Denies neck pain.  Exam demonstrates +Tinels's at right wrist and right elbow, weakness in median,ulnar and radial muscles. Unclear if pain is affecting strength testing. Will order an emg/ncs to evaluate for compression neuropathy vs radiculopathy.  [name], MD  [facility] [address] [place], [place] [address]  Phone [phone] Fax [phone]  [name]

## 2014-06-18 NOTE — Patient Instructions (Signed)
Overall you are doing fairly well but I do want to suggest a few things today:   Remember to drink plenty of fluid, eat healthy meals and do not skip any meals. Try to eat protein with a every meal and eat a healthy snack such as fruit or nuts in between meals. Try to keep a regular sleep-wake schedule and try to exercise daily, particularly in the form of walking, 20-30 minutes a day, if you can.   As far as your medications are concerned, I would like to suggest  As far as diagnostic testing: EMG/NCS  I would like to see you back in one week for EMG/NCS, sooner if we need to. Please call us with any interim questions, concerns, problems, updates or refill requests.   Please also call us for any test results so we can go over those with you on the phone.  My clinical assistant and will answer any of your questions and relay your messages to me and also relay most of my messages to you.   Our phone number is [phone]. We also have an after hours call service for urgent matters and there is a physician on-call for urgent questions. For any emergencies you know to call 911 or go to the nearest emergency room

## 2014-06-20 ENCOUNTER — Encounter: Payer: Self-pay | Admitting: Neurology

## 2014-06-20 DIAGNOSIS — G5691 Unspecified mononeuropathy of right upper limb: Secondary | ICD-10-CM | POA: Insufficient documentation

## 2014-06-25 ENCOUNTER — Ambulatory Visit (INDEPENDENT_AMBULATORY_CARE_PROVIDER_SITE_OTHER): Payer: 59 | Admitting: Neurology

## 2014-06-25 ENCOUNTER — Encounter (INDEPENDENT_AMBULATORY_CARE_PROVIDER_SITE_OTHER): Payer: Self-pay

## 2014-06-25 DIAGNOSIS — G5601 Carpal tunnel syndrome, right upper limb: Secondary | ICD-10-CM

## 2014-06-25 DIAGNOSIS — G5691 Unspecified mononeuropathy of right upper limb: Secondary | ICD-10-CM

## 2014-06-25 DIAGNOSIS — Z0289 Encounter for other administrative examinations: Secondary | ICD-10-CM

## 2014-06-25 NOTE — Progress Notes (Addendum)
[facility]   Provider: Dr Jaynee Arnold  Referring Provider: Marda Stalker, PA-C  Primary Care Physician: No primary provider on file.   CC: Hand pain   HPI: Christopher Arnold is a 68 y.o. male here as a referral from Christopher Arnold for right hand weakness. PMHx uncontrolled diabetes. Pain in the hand with weakness for 2-3 months. No inciting factors. However he types a lot. When he is sleeping feels numb and wakes up. He can't make a fist, weak grip and is dropping objects. Going on since July. Wakes up shaking it out with pain in the wrist and pain in digits 2-5. Tries to massage his hands. Sometimes he has a tremor in that hand. Icing doesn't help nor do OTC pain meds. Denies wearing wrist brace. Pain is worse with typing use. Pain and weakness describes as severe, can't even make a fist Tingling, burning as well. Happens daily, weakness is continuous. Has radiation up the ulnar aspect of his forearm. Also point to the medial ventral wrist and says it hurts in that spot. Has not noticed any atrophy, sometimes wrist is swollen. No FHx of neuromuscular disorders.  Denies neck pain. Exam demonstrates +Tinels's at right wrist and right elbow, weakness in median,ulnar and radial muscles. Unclear if pain is affecting strength testing. Will order an emg/ncs to evaluate for compression neuropathy vs radiculopathy.  Summary: Evaluation of the Right median motor nerve showed delayed distal onset latency(4.68ms, N<4.68ms) and delayed F Response (35.8ms, N<63ms) Right Ulnar ADM motor nerve was within normal limits with normal F response latency.   Right Radial motor nerve was within normal limits. Right Ulnar FDI motor nerve showed no response.    Right  Ulnar 5th digit sensory nerve was within normal limits.   Right Dorsal Ulnar Cutaneous sensory nerve showed no response (Wrist).   Right 2nd-digit Median sensory showed delayed distal peak latency(4.6ms, N<3.41ms) Right Radial sensory nerve  were within normal limits.   Medial Antebrachial Cutaneous Sensory nerve was within normal limits. Patient declined left-sided NCS comparisons   Needle evaluation of the right first dorsal interosseous muscle showed increased spontaneous activity.  The right abductor digiti minimi muscle showed increased spontaneous activity and diminished recruitment.  The following muscles were within normal limits: right Deltoid, right Triceps, right Flexor Digitorum Profundus, right Pronator teres, right Opponens Pollicis. EMG needle study was terminated prematurely at the request of patient.  **Patient declined left-sided NCS comparisons and also declined any further EMG needle testing including cervical paraspinals.**   Conclusion: Limited study, patient refused further NCS and EMG testing.  1. There electrophysiologic evidence for moderately-severe right Carpal Tunnel Syndrome  2. There is ulnar muscle denervation restricted to ulnar hand muscles(FDI and ADM). No denervation seen in one ulnar forearm muscle(patient declined any further EMG testing on other Ulnar forearm muscles). The right Ulnar FDI motor nerve showed no response which is out of proportion to the normal Ulnar motor study recording the ADM. Cannot rule out ulnar entrapment at the wrist.  3. Absent right Dorsal Ulnar Cutaneous(DUC) sensory potential is consistent with compression across the elbow however the right Ulnar ADM motor nerve was normal without a drop in velocity across the elbow or conduction block across the elbow. The absent DUC may be due to technical factors. Patient declined repeat testing and contralateral comparisons.   4. No electrophysiologic evidence for cervical radiculopathy and patient denies neck pain.  Christopher Ill, MD  [facility] [address] [place], [place] [address]  Phone [phone] Fax [phone]     Christopher Arnold

## 2014-06-29 ENCOUNTER — Telehealth: Payer: Self-pay | Admitting: Neurology

## 2014-06-29 NOTE — Addendum Note (Signed)
Addended by: [name] B on: 06/29/2014 11:54 PM   Modules accepted: Orders

## 2014-06-29 NOTE — Telephone Encounter (Signed)
Casandra - Can you get Mr. Schellhase refer to hand surgeon's please? Need ti also send over my emg/ncs results. Thanks.

## 2014-06-30 NOTE — Telephone Encounter (Signed)
Referred to Dr. Levell July office. Sent over all notes as requested.

## 2014-08-21 ENCOUNTER — Encounter (HOSPITAL_COMMUNITY): Payer: Self-pay | Admitting: Cardiovascular Disease

## 2015-04-09 DIAGNOSIS — R002 Palpitations: Secondary | ICD-10-CM | POA: Diagnosis not present

## 2015-04-09 DIAGNOSIS — E1165 Type 2 diabetes mellitus with hyperglycemia: Secondary | ICD-10-CM | POA: Diagnosis not present

## 2015-04-14 ENCOUNTER — Telehealth: Payer: Self-pay | Admitting: Cardiovascular Disease

## 2015-04-14 NOTE — Telephone Encounter (Signed)
Received records from [place] for appointment on 04/21/15 with Dr Oval Linsey.  Records given to [facility] (medical records) for Dr Blenda Mounts schedule on 04/21/15. lp

## 2015-04-20 NOTE — Progress Notes (Signed)
Cardiology Office Note   Date:  04/20/2015   ID:  Christopher Arnold, DOB [birth year], MRN [id number]  PCP:  No primary care provider on file.  Cardiologist:   Sharol Harness, MD   No chief complaint on file.     History of Present Illness: Christopher Arnold is a 69 y.o. male with DM type II (A1c 7.5 03/2014) who presents for palpitations.  His palpitations first began 3 years ago. He reports episodes that last for 5-10 seconds where seems as though his heart stops beating. This is followed by a rapid succession of heart pounding. The palpitations are so strong that he can feel them into his arm. There is no associated lightheadedness, dizziness, shortness of breath, or chest pain. He reported these symptoms to his primary care physician who did an EKG however it did not catch any of the abnormal heart rhythm. His TSH was checked by Christopher Arnold, [place] [facility].  However the results of this test are not yet known.  The episodes occur randomly and are not provoked by exercise. He does not drink caffeine,, gets good sleep, and does not have any stress.  Christopher Arnold does not get any formal exercise. He eats a healthy diet and cooks all his meals at home so that he can keep his diabetes under control. His weight is up 2 kg in the last couple of months.   Christopher Arnold was admitted to the hospital 03/2014 with CP and a code STEMI was activated for inferior Q waves and minimal ST elevation.  He underwent cardiac catheterization and was found to have no CAD.  Ultimately, he was diagnosed with cholecystitis and underwent cholecystectomy.   Past Medical History  Diagnosis Date  . Diabetes mellitus without complication   . Osteoporosis     Past Surgical History  Procedure Laterality Date  . Appendectomy  remote  . Esophagogastroduodenoscopy N/A 03/23/2014    Procedure: ESOPHAGOGASTRODUODENOSCOPY (EGD);  Surgeon: Juanita Craver, MD;  Location: [facility] ENDOSCOPY;  Service: Endoscopy;   Laterality: N/A;  . Cholecystectomy N/A 03/25/2014    Procedure: LAPAROSCOPIC CHOLECYSTECTOMY;  Surgeon: Harl Bowie, MD;  Location: [place];  Service: General;  Laterality: N/A;  . Left heart cath N/A 03/22/2014    Procedure: LEFT HEART CATH;  Surgeon: Blane Ohara, MD;  Location: [facility] CATH LAB;  Service: Cardiovascular;  Laterality: N/A;     Current Outpatient Prescriptions  Medication Sig Dispense Refill  . Linagliptin-Metformin HCl (JENTADUETO) 2.01-999 MG TABS Take 2 tablets by mouth daily.      No current facility-administered medications for this visit.    Allergies:   Tylenol    Social History:  The patient  reports that he quit smoking about 5 years ago. He has never used smokeless tobacco. He reports that he does not drink alcohol or use illicit drugs.   Family History:  The patient's  family history is negative for Neuropathy.    ROS:  Please see the history of present illness.   Otherwise, review of systems are positive for knee pain.   All other systems are reviewed and negative.    PHYSICAL EXAM: VS:  There were no vitals taken for this visit. , BMI There is no weight on file to calculate BMI. GENERAL:  Well appearing HEENT:  Pupils equal round and reactive, fundi not visualized, oral mucosa unremarkable NECK:  No jugular venous distention, waveform within normal limits, carotid upstroke brisk and symmetric, no bruits, no thyromegaly LYMPHATICS:  No cervical adenopathy LUNGS:  Clear to auscultation bilaterally HEART:  RRR.  PMI not displaced or sustained,S1 and S2 within normal limits, no S3, no S4, no clicks, no rubs, no murmurs ABD:  Flat, positive bowel sounds normal in frequency in pitch, no bruits, no rebound, no guarding, no midline pulsatile mass, no hepatomegaly, no splenomegaly EXT:  2 plus pulses throughout, no edema, no cyanosis no clubbing SKIN:  No rashes no nodules NEURO:  Cranial nerves II through XII grossly intact, motor grossly intact  throughout PSYCH:  Cognitively intact, oriented to person place and time    EKG:  EKG is ordered today. The ekg ordered today demonstrates sinus rhythm at 65 bpm.  Absent R wave progression.  One PAC.   Recent Labs: No results found for requested labs within last 365 days.    Lipid Panel    Component Value Date/Time   CHOL  08/06/2008 0320    171        ATP III CLASSIFICATION:  <200     mg/dL   Desirable  200-239  mg/dL   Borderline High  >=240    mg/dL   High   TRIG 72 08/06/2008 0320   HDL 44 08/06/2008 0320   CHOLHDL 3.9 08/06/2008 0320   VLDL 14 08/06/2008 0320   LDLCALC * 08/06/2008 0320    113        Total Cholesterol/HDL:CHD Risk Coronary Heart Disease Risk Table                     Men   Women  1/2 Average Risk   3.4   3.3      Wt Readings from Last 3 Encounters:  06/18/14 83.915 kg (185 lb)  04/22/14 82.736 kg (182 lb 6.4 oz)  03/26/14 85 kg (187 lb 6.3 oz)      Other studies Reviewed: Additional studies/ records that were reviewed today include: PCP records. Review of the above records demonstrates:  Please see elsewhere in the note.     ASSESSMENT AND PLAN:  # Palpitations:  Christopher Arnold's palpitations are not associated with CP or pre-syncope.  The frequency is somewhat unclear from his description.  Given that they don't seem to occur daily, we will obtain a long-term monitor in an attempt to catch them. His thyroid has been checked with his primary care physician. Therefore we will only check or joints today. Given that they're nonexertional it does not seem that an ischemia evaluation is warranted at this time.  There are no other clear precipitants as his sleep apnea assessment was very low indicating its unlikely he has sleep apnea. - 21 day event monitor - check BMP and Mg - TSH checked with PCP  # HL: Patient reports that his lipids have been wnl with his PCP.  I tried to explain that he should still be on a statin due to his diabetes.  However he declined the suggestion. It is unclear that he truly understood O staining. However he declined having an interpreter present.  # AAA screening: Given Christopher Arnold's history of tobacco abuse of his age she should have a screening abdominal aortic ultrasound. However he declined this as well. Would recommend that he reconsider and discuss with his PCP.   Current medicines are reviewed at length with the patient today.  The patient does not have concerns regarding medicines.  The following changes have been made:  no change  Labs/ tests ordered today include: 21 day Holter  No orders of the defined types were placed in this encounter.     Disposition:   FU with Dr. Jonelle Sidle C. [place] in 6 months    Signed, Sharol Harness, MD  04/20/2015 4:59 PM    [facility]

## 2015-04-21 ENCOUNTER — Ambulatory Visit (INDEPENDENT_AMBULATORY_CARE_PROVIDER_SITE_OTHER): Payer: Medicare Other | Admitting: Cardiovascular Disease

## 2015-04-21 ENCOUNTER — Encounter (INDEPENDENT_AMBULATORY_CARE_PROVIDER_SITE_OTHER): Payer: Medicare Other

## 2015-04-21 ENCOUNTER — Encounter: Payer: Self-pay | Admitting: Cardiovascular Disease

## 2015-04-21 VITALS — BP 126/80 | HR 65 | Ht 68.25 in | Wt 187.2 lb

## 2015-04-21 DIAGNOSIS — R079 Chest pain, unspecified: Secondary | ICD-10-CM | POA: Diagnosis not present

## 2015-04-21 DIAGNOSIS — I208 Other forms of angina pectoris: Secondary | ICD-10-CM

## 2015-04-21 DIAGNOSIS — R002 Palpitations: Secondary | ICD-10-CM

## 2015-04-21 LAB — BASIC METABOLIC PANEL
BUN: 12 mg/dL (ref 7–25)
CO2: 29 mmol/L (ref 20–31)
Calcium: 9.1 mg/dL (ref 8.6–10.3)
Chloride: 103 mmol/L (ref 98–110)
Creat: 0.66 mg/dL — ABNORMAL LOW (ref 0.70–1.25)
GLUCOSE: 152 mg/dL — AB (ref 65–99)
POTASSIUM: 4.5 mmol/L (ref 3.5–5.3)
SODIUM: 143 mmol/L (ref 135–146)

## 2015-04-21 LAB — MAGNESIUM: Magnesium: 1.8 mg/dL (ref 1.5–2.5)

## 2015-04-21 NOTE — Patient Instructions (Addendum)
Please have labs down at lab draw station- on the first floor [facility] LABS-MAGNESIUM,BMP  [place] 21 DAYS-Your physician has requested that you have an echocardiogram. Echocardiography is a painless test that uses sound waves to create images of your heart. It provides your doctor with information about the size and shape of your heart and how well your heart's chambers and valves are working. This procedure takes approximately one hour. There are no restrictions for this procedure.  WILL CONTACT YOU WITH RESULTS   Your physician wants you to follow-up in 6 MONTHS DR [place]- 30 MIN APPOINTMENT. You will receive a reminder letter in the mail two months in advance. If you don't receive a letter, please call our office to schedule the follow-up appointment.  Cardiac Event Monitoring A cardiac event monitor is a small recording device used to help detect abnormal heart rhythms (arrhythmias). The monitor is used to record heart rhythm when noticeable symptoms such as the following occur:  Fast heartbeats (palpitations), such as heart racing or fluttering.  Dizziness.  Fainting or light-headedness.  Unexplained weakness. The monitor is wired to two electrodes placed on your chest. Electrodes are flat, sticky disks that attach to your skin. The monitor can be worn for up to 30 days. You will wear the monitor at all times, except when bathing.  HOW TO USE YOUR CARDIAC EVENT MONITOR A technician will prepare your chest for the electrode placement. The technician will show you how to place the electrodes, how to work the monitor, and how to replace the batteries. Take time to practice using the monitor before you leave the office. Make sure you understand how to send the information from the monitor to your health care provider. This requires a telephone with a landline, not a cell phone. You need to:  Wear your monitor at all times, except when you are in water:  Do not  get the monitor wet.  Take the monitor off when bathing. Do not swim or use a hot tub with it on.  Keep your skin clean. Do not put body lotion or moisturizer on your chest.  Change the electrodes daily or any time they stop sticking to your skin. You might need to use tape to keep them on.  It is possible that your skin under the electrodes could become irritated. To keep this from happening, try to put the electrodes in slightly different places on your chest. However, they must remain in the area under your left breast and in the upper right section of your chest.  Make sure the monitor is safely clipped to your clothing or in a location close to your body that your health care provider recommends.  Press the button to record when you feel symptoms of heart trouble, such as dizziness, weakness, light-headedness, palpitations, thumping, shortness of breath, unexplained weakness, or a fluttering or racing heart. The monitor is always on and records what happened slightly before you pressed the button, so do not worry about being too late to get good information.  Keep a diary of your activities, such as walking, doing chores, and taking medicine. It is especially important to note what you were doing when you pushed the button to record your symptoms. This will help your health care provider determine what might be contributing to your symptoms. The information stored in your monitor will be reviewed by your health care provider alongside your diary entries.  Send the recorded information as recommended by  your health care provider. It is important to understand that it will take some time for your health care provider to process the results.  Change the batteries as recommended by your health care provider. SEEK IMMEDIATE MEDICAL CARE IF:   You have chest pain.  You have extreme difficulty breathing or shortness of breath.  You develop a very fast heartbeat that persists.  You develop  dizziness that does not go away.  You faint or constantly feel you are about to faint. Document Released: 06/07/2008 Document Revised: 01/13/2014 Document Reviewed: 02/25/2013 [facility] Patient Information 2015 [place], [place]. This information is not intended to replace advice given to you by your health care provider. Make sure you discuss any questions you have with your health care provider.

## 2015-04-24 ENCOUNTER — Telehealth: Payer: Self-pay | Admitting: *Deleted

## 2015-04-24 NOTE — Telephone Encounter (Signed)
LEFT MESSAGE TO CALL BACK- REGARDING  LAB RESULTS

## 2015-04-24 NOTE — Telephone Encounter (Signed)
-----   Message from Skeet Latch, MD sent at 04/21/2015  9:15 PM EDT ----- Please let Mr. Munter know his labs look good, though his glucose is high.

## 2015-04-27 ENCOUNTER — Encounter: Payer: Self-pay | Admitting: *Deleted

## 2015-04-27 NOTE — Telephone Encounter (Signed)
Mailed letter with results. 

## 2015-05-21 ENCOUNTER — Encounter: Payer: Self-pay | Admitting: *Deleted

## 2015-07-07 DIAGNOSIS — E119 Type 2 diabetes mellitus without complications: Secondary | ICD-10-CM | POA: Diagnosis not present

## 2015-10-19 ENCOUNTER — Encounter: Payer: Medicare Other | Admitting: Cardiovascular Disease

## 2015-10-19 NOTE — Progress Notes (Signed)
This encounter was created in error - please disregard.

## 2015-10-20 ENCOUNTER — Encounter: Payer: Self-pay | Admitting: *Deleted

## 2015-12-16 DIAGNOSIS — E7800 Pure hypercholesterolemia, unspecified: Secondary | ICD-10-CM | POA: Diagnosis not present

## 2015-12-16 DIAGNOSIS — Z789 Other specified health status: Secondary | ICD-10-CM | POA: Diagnosis not present

## 2015-12-16 DIAGNOSIS — Z125 Encounter for screening for malignant neoplasm of prostate: Secondary | ICD-10-CM | POA: Diagnosis not present

## 2015-12-16 DIAGNOSIS — Z7984 Long term (current) use of oral hypoglycemic drugs: Secondary | ICD-10-CM | POA: Diagnosis not present

## 2015-12-16 DIAGNOSIS — E1165 Type 2 diabetes mellitus with hyperglycemia: Secondary | ICD-10-CM | POA: Diagnosis not present

## 2016-05-03 ENCOUNTER — Encounter (HOSPITAL_BASED_OUTPATIENT_CLINIC_OR_DEPARTMENT_OTHER): Payer: Self-pay

## 2016-05-03 ENCOUNTER — Emergency Department (HOSPITAL_BASED_OUTPATIENT_CLINIC_OR_DEPARTMENT_OTHER)
Admission: EM | Admit: 2016-05-03 | Discharge: 2016-05-03 | Disposition: A | Discharge status: Home / Self Care | Payer: Medicare Other | Attending: Physician Assistant | Admitting: Physician Assistant

## 2016-05-03 ENCOUNTER — Emergency Department (HOSPITAL_BASED_OUTPATIENT_CLINIC_OR_DEPARTMENT_OTHER): Payer: Medicare Other

## 2016-05-03 DIAGNOSIS — E119 Type 2 diabetes mellitus without complications: Secondary | ICD-10-CM | POA: Diagnosis not present

## 2016-05-03 DIAGNOSIS — Z7984 Long term (current) use of oral hypoglycemic drugs: Secondary | ICD-10-CM | POA: Diagnosis not present

## 2016-05-03 DIAGNOSIS — M25472 Effusion, left ankle: Secondary | ICD-10-CM | POA: Diagnosis not present

## 2016-05-03 DIAGNOSIS — Z87891 Personal history of nicotine dependence: Secondary | ICD-10-CM | POA: Insufficient documentation

## 2016-05-03 DIAGNOSIS — M7989 Other specified soft tissue disorders: Secondary | ICD-10-CM | POA: Diagnosis not present

## 2016-05-03 MED ORDER — CEPHALEXIN 500 MG PO CAPS: 500.0000 mg | ORAL_CAPSULE | Freq: Four times a day (QID) | ORAL | 0 refills | Status: DC

## 2016-05-03 NOTE — ED Triage Notes (Signed)
Swelling/pain to left LE-sent from PCP for [place] per pt-son/interpreter for pt

## 2016-05-03 NOTE — ED Notes (Signed)
Pt made aware to return if symptoms worsen or if any life threatening symptoms occur.   

## 2016-05-03 NOTE — ED Notes (Signed)
Pt refused vital recheck 

## 2016-05-03 NOTE — ED Provider Notes (Signed)
[place] DEPT [facility] Provider Note   CSN: [id number] Arrival date & time: 05/03/16  1509     History   Chief Complaint Chief Complaint  Patient presents with  . Leg Pain    HPI Christopher Arnold is a 70 y.o. male.  HPI   Patient is a 70 year old male presenting with left lower leg swelling. Patient had this for last 8 days. He has no fever. No shortness of breath. No long travel. No long car trips. No prolonged immobilization for surgery.  Patient reports mild pain to the calf. No trauma. Patient sent here from [place] walk-in clinic for ultrasound.  Past Medical History:  Diagnosis Date  . Diabetes mellitus without complication ([place])   . Osteoporosis   . Palpitations     Patient Active Problem List   Diagnosis Date Noted  . Palpitations   . 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 Cholecystitis, acute 03/24/2014  . Fever, unspecified 03/23/2014  . Leukocytosis, unspecified 03/23/2014  . Anginal chest pain at rest [facility]) 03/22/2014  . Type 2 diabetes mellitus ([place]) 03/22/2014  . Chest pain at rest 03/22/2014    Past Surgical History:  Procedure Laterality Date  . APPENDECTOMY  remote  . CHOLECYSTECTOMY N/A 03/25/2014   Procedure: LAPAROSCOPIC CHOLECYSTECTOMY;  Surgeon: Harl Bowie, MD;  Location: [facility];  Service: General;  Laterality: N/A;  . ESOPHAGOGASTRODUODENOSCOPY N/A 03/23/2014   Procedure: ESOPHAGOGASTRODUODENOSCOPY (EGD);  Surgeon: Juanita Craver, MD;  Location: [facility] ENDOSCOPY;  Service: Endoscopy;  Laterality: N/A;  . LEFT HEART CATH N/A 03/22/2014   Procedure: LEFT HEART CATH;  Surgeon: Blane Ohara, MD;  Location: [facility] CATH LAB;  Service: Cardiovascular;  Laterality: N/A;       Home Medications    Prior to Admission medications   Medication Sig Start Date End Date Taking? Authorizing Provider  Linagliptin-Metformin HCl (JENTADUETO) 2.01-999 MG TABS Take 2 tablets by mouth daily.     Historical Provider, MD     Family History Family History  Problem Relation Age of Onset  . Neuropathy Neg Hx     Social History Social History  Substance Use Topics  . Smoking status: Former Smoker    Quit date: 04/22/2005  . Smokeless tobacco: Never Used  . Alcohol use 0.0 oz/week     Comment: occ     Allergies   Review of patient's allergies indicates no known allergies.   Review of Systems Review of Systems  Constitutional: Negative for activity change.  Respiratory: Negative for shortness of breath.   Cardiovascular: Positive for leg swelling. Negative for chest pain.  Gastrointestinal: Negative for abdominal pain.  All other systems reviewed and are negative.    Physical Exam Updated Vital Signs BP 127/74 (BP Location: Left Arm)   Pulse 69   Temp 98.2 F (36.8 C) (Oral)   Resp 18   Ht 5' 8.11" (1.73 m)   Wt 182 lb (82.6 kg)   SpO2 97%   BMI 27.58 kg/m   Physical Exam  Constitutional: He is oriented to person, place, and time. He appears well-nourished.  HENT:  Head: Normocephalic.  Eyes: Conjunctivae are normal.  Cardiovascular: Normal rate.   Pulmonary/Chest: Effort normal and breath sounds normal.  Musculoskeletal: He exhibits edema.  Trace edema bilateral lower extremities. Mild trace swelling to left. trace erythema, pain to palpation back of calf and lateral aspect of calf. No signs of trauma.  Neurological: He is oriented to person, place, and time.  Skin: Skin is warm  and dry. He is not diaphoretic.  Psychiatric: He has a normal mood and affect. His behavior is normal.     ED Treatments / Results  Labs (all labs ordered are listed, but only abnormal results are displayed) Labs Reviewed - No data to display  EKG  EKG Interpretation None       Radiology No results found.  Procedures Procedures (including critical care time)  Medications Ordered in ED Medications - No data to display   Initial Impression / Assessment and Plan / ED Course  I have  reviewed the triage vital signs and the nursing notes.  Pertinent labs & imaging results that were available during my care of the patient were reviewed by me and considered in my medical decision making (see chart for details).  Clinical Course    \Patient is a 70 year old gentleman with past medical history significant for diabetes presenting with trace swelling to left lower extremity. Patient sent here from walk-in for lower extremity duplex ultrasound. We will look for signs of clot.   If negative will have patient follow up with his primary care physician. Willl treat with abx because of the redness and pain just in case.  Likely venous insufficiency,    Final Clinical Impressions(s) / ED Diagnoses   Final diagnoses:  None    New Prescriptions New Prescriptions   No medications on file     Courteney Julio Alm, MD 05/03/16 1849

## 2016-05-03 NOTE — Discharge Instructions (Signed)
We are unsure what is causing the swelling in your legs, we want you to return to follow up with your primary care physician.   Since there is a small amount of redness with pain, we will give antibitoics in case there is a small amount of infection.

## 2016-05-04 DIAGNOSIS — M25572 Pain in left ankle and joints of left foot: Secondary | ICD-10-CM | POA: Diagnosis not present

## 2016-08-09 DIAGNOSIS — Z7984 Long term (current) use of oral hypoglycemic drugs: Secondary | ICD-10-CM | POA: Diagnosis not present

## 2016-08-09 DIAGNOSIS — M549 Dorsalgia, unspecified: Secondary | ICD-10-CM | POA: Diagnosis not present

## 2016-08-09 DIAGNOSIS — E1165 Type 2 diabetes mellitus with hyperglycemia: Secondary | ICD-10-CM | POA: Diagnosis not present

## 2017-01-16 DIAGNOSIS — Z7984 Long term (current) use of oral hypoglycemic drugs: Secondary | ICD-10-CM | POA: Diagnosis not present

## 2017-01-16 DIAGNOSIS — E1165 Type 2 diabetes mellitus with hyperglycemia: Secondary | ICD-10-CM | POA: Diagnosis not present

## 2017-02-24 DIAGNOSIS — Z7984 Long term (current) use of oral hypoglycemic drugs: Secondary | ICD-10-CM | POA: Diagnosis not present

## 2017-02-24 DIAGNOSIS — E1165 Type 2 diabetes mellitus with hyperglycemia: Secondary | ICD-10-CM | POA: Diagnosis not present

## 2017-03-06 DIAGNOSIS — H2513 Age-related nuclear cataract, bilateral: Secondary | ICD-10-CM | POA: Diagnosis not present

## 2017-04-20 DIAGNOSIS — I6523 Occlusion and stenosis of bilateral carotid arteries: Secondary | ICD-10-CM | POA: Diagnosis not present

## 2017-04-21 ENCOUNTER — Telehealth: Payer: Self-pay

## 2017-04-21 NOTE — Telephone Encounter (Signed)
SENT NOTES TO SCHEDULING 

## 2017-04-25 ENCOUNTER — Telehealth: Payer: Self-pay | Admitting: Cardiovascular Disease

## 2017-04-25 NOTE — Telephone Encounter (Signed)
Received records from [facility] for appointment on 04/26/17 with Dr Oval Linsey.  Records put with Dr Blenda Mounts schedule for 04/26/17. lp

## 2017-04-26 ENCOUNTER — Encounter: Payer: Self-pay | Admitting: Cardiovascular Disease

## 2017-04-26 ENCOUNTER — Ambulatory Visit (INDEPENDENT_AMBULATORY_CARE_PROVIDER_SITE_OTHER): Payer: Medicare Other | Admitting: Cardiovascular Disease

## 2017-04-26 VITALS — BP 120/72 | HR 64 | Ht 68.5 in | Wt 194.6 lb

## 2017-04-26 DIAGNOSIS — E7800 Pure hypercholesterolemia, unspecified: Secondary | ICD-10-CM | POA: Diagnosis not present

## 2017-04-26 DIAGNOSIS — I6523 Occlusion and stenosis of bilateral carotid arteries: Secondary | ICD-10-CM

## 2017-04-26 DIAGNOSIS — Z136 Encounter for screening for cardiovascular disorders: Secondary | ICD-10-CM | POA: Diagnosis not present

## 2017-04-26 DIAGNOSIS — I739 Peripheral vascular disease, unspecified: Secondary | ICD-10-CM

## 2017-04-26 DIAGNOSIS — Z87891 Personal history of nicotine dependence: Secondary | ICD-10-CM | POA: Diagnosis not present

## 2017-04-26 MED ORDER — ROSUVASTATIN CALCIUM 10 MG PO TABS: 10.0000 mg | ORAL_TABLET | Freq: Every day | ORAL | 5 refills | Status: DC

## 2017-04-26 NOTE — Progress Notes (Signed)
Cardiology Office Note   Date:  04/26/2017   ID:  Yussuf, Sawyers 1946/02/03, MRN [id number]  PCP:  Lawerance Cruel, MD  Cardiologist:   Skeet Latch, MD   No chief complaint on file.    History of Present Illness: Christopher Arnold is a 71 y.o. male from [place] with DM type II (A1c 7.5 03/2014) and palpitationswho presents for follow up.  He was initially seen 04/2015 for palpitations.  He wore a 21 day event monitor that showed occasional PACs and idioventricular rhythm at a rate of 80 bpm.  He denies any recent palpitations.  Since his last appointment Christopher Arnold saw his dentist and had a CT scan that showed significant atherosclerosis of the internal carotids.  He saw his PCP, Dr. Lennette Bihari Via, and was referred back to cardiology.  He is requesting a full body scan.  He denies chest pain or shortness of breath.  He doesn't get much formal exercise but is very physically active.  He reports lower extremity claudication after walking 10-15 minutes.  This improves with rest.  He denies lower extremity edema, orthopnea or PND.  He also denies lightheadedness, dizziness, acute vision changes, slurred speech, numbness, tingling or weakness.   Christopher Arnold was admitted to the hospital 03/2014 with CP and a code STEMI was activated for inferior Q waves and minimal ST elevation.  He underwent cardiac catheterization and was found to have no CAD.  Ultimately, he was diagnosed with cholecystitis and underwent cholecystectomy.  He previously smoked between 0.5 and 2 ppd for 35 years.    Past Medical History:  Diagnosis Date  . Diabetes mellitus without complication ([place])   . Osteoporosis   . Palpitations     Past Surgical History:  Procedure Laterality Date  . APPENDECTOMY  remote  . CHOLECYSTECTOMY N/A 03/25/2014   Procedure: LAPAROSCOPIC CHOLECYSTECTOMY;  Surgeon: Harl Bowie, MD;  Location: [facility];  Service: General;  Laterality: N/A;  . ESOPHAGOGASTRODUODENOSCOPY N/A  03/23/2014   Procedure: ESOPHAGOGASTRODUODENOSCOPY (EGD);  Surgeon: Juanita Craver, MD;  Location[facility] ENDOSCOPY;  Service: Endoscopy;  Laterality: N/A;  . LEFT HEART CATH N/A 03/22/2014   Procedure: LEFT HEART CATH;  Surgeon: Blane Ohara, MD;  Location: [facility] CATH LAB;  Service: Cardiovascular;  Laterality: N/A;     Current Outpatient Prescriptions  Medication Sig Dispense Refill  . aspirin EC 81 MG tablet Take 81 mg by mouth daily.    Marland Kitchen glimepiride (AMARYL) 1 MG tablet Take 1 mg by mouth daily.    . Linagliptin-Metformin HCl (JENTADUETO) 2.01-999 MG TABS Take 2 tablets by mouth daily.     . rosuvastatin (CRESTOR) 10 MG tablet Take 1 tablet (10 mg total) by mouth daily. 30 tablet 5   No current facility-administered medications for this visit.     Allergies:   Patient has no known allergies.    Social History:  The patient  reports that he quit smoking about 12 years ago. He has never used smokeless tobacco. He reports that he drinks alcohol. He reports that he does not use drugs.   Family History:  The patient's  family history is not on file.    ROS:  Please see the history of present illness.   Otherwise, review of systems are positive for none.   All other systems are reviewed and negative.    PHYSICAL EXAM: VS:  BP 120/72   Pulse 64   Ht 5' 8.5" (1.74 m)   Wt 88.3 kg (194  lb 9.6 oz)   BMI 29.16 kg/m  , BMI Body mass index is 29.16 kg/m. GENERAL:  Well appearing HEENT: Pupils equal round and reactive, fundi not visualized, oral mucosa unremarkable NECK:  No jugular venous distention, waveform within normal limits, carotid upstroke brisk and symmetric, no bruits, no thyromegaly LYMPHATICS:  No cervical adenopathy LUNGS:  Clear to auscultation bilaterally.  No crackles, wheezes or rhonchi HEART:  RRR.  PMI not displaced or sustained,S1 and S2 within normal limits, no S3, no S4, no clicks, no rubs, no murmurs ABD:  Flat, positive bowel sounds normal in frequency in pitch, no  bruits, no rebound, no guarding, no midline pulsatile mass, no hepatomegaly, no splenomegaly EXT:  2+ radial pulses.  1+ DP/PT bilaterally. No edema, no cyanosis no clubbing SKIN:  Decreased hair in lower 1/3 of LEs NEURO:  Cranial nerves II through XII grossly intact, motor grossly intact throughout PSYCH:  Cognitively intact, oriented to person place and time   EKG:  EKG is ordered today. The ekg ordered 04/21/15 demonstrates sinus rhythm at 65 bpm.  Absent R wave progression.  One PAC. 04/26/17: Sinus rhythm.  Rate 64 bpm  21 Day Event Monitor 04/21/15: Highest heart rate: 142 bpm Lowest heart rate 51 bpm Average heart rate 72 bpm  Rhythms: sinus rhythm, sinus tachycardia Accelerated idioventricular rhythm, rate ~80 bpm Occasional PACs   Recent Labs: No results found for requested labs within last 8760 hours.   04/20/17:  Total cholesterol 160, triglycerides 107, HDL 38, LDL 100 Sodium 142, potassium 4.3, BUN 16, creatinine 0.78 AST 24, ALT 39  Lipid Panel    Component Value Date/Time   CHOL  08/06/2008 0320    171        ATP III CLASSIFICATION:  <200     mg/dL   Desirable  200-239  mg/dL   Borderline High  >=240    mg/dL   High   TRIG 72 08/06/2008 0320   HDL 44 08/06/2008 0320   CHOLHDL 3.9 08/06/2008 0320   VLDL 14 08/06/2008 0320   LDLCALC (H) 08/06/2008 0320    113        Total Cholesterol/HDL:CHD Risk Coronary Heart Disease Risk Table                     Men   Women  1/2 Average Risk   3.4   3.3      Wt Readings from Last 3 Encounters:  04/26/17 88.3 kg (194 lb 9.6 oz)  05/03/16 82.6 kg (182 lb)  04/21/15 84.9 kg (187 lb 3.2 oz)      Other studies Reviewed: Additional studies/ records that were reviewed today include: PCP records. Review of the above records demonstrates:  Please see elsewhere in the note.     ASSESSMENT AND PLAN:  # Carotid stenosis: Severity unknown.  He was noted to have calcification on CT scan.  We will get a carotid  ultrasound.  Start aspirin 81mg  daily and rosuvastatin 10 mg daily.  # Palpitations:  No recent episodes.  None recorded on event monitor, though he didn't have symptoms while weawring it.  # HL: LDL is 100.  Goal is <70.  Start rosuvastatin 10 mg daily.  Repeat lipids and CMP in 6 weeks.   # AAA screening: Given ChristopherNichelson's history of tobacco abuse of his age she should have a screening abdominal aortic ultrasound. He will get this at the time of his carotid Dopplers and LE Dopplers.  #  Claudication: LE Arterial Dopplers and  ABIs.   Current medicines are reviewed at length with the patient today.  The patient does not have concerns regarding medicines.  The following changes have been made:  Add aspirin and rosuvastatin  Labs/ tests ordered today include:  Orders Placed This Encounter  Procedures  . EKG 12-Lead     Disposition:   FU with Dr. Jonelle Sidle C. Valdez in 2 months    Signed, Skeet Latch, MD  04/26/2017 4:02 PM    [facility]

## 2017-04-26 NOTE — Patient Instructions (Addendum)
Medication Instructions:  START ASPIRIN 81 MG DAILY   START ROSUVASTATIN 10 MG DAILY   Labwork: FASTING LP/CMET IN 6 WEEKS  Testing/Procedures: Your physician has requested that you have a carotid duplex. This test is an ultrasound of the carotid arteries in your neck. It looks at blood flow through these arteries that supply the brain with blood. Allow one hour for this exam. There are no restrictions or special instructions.  Your physician has requested that you have a lower or upper extremity arterial duplex. This test is an ultrasound of the arteries in the legs or arms. It looks at arterial blood flow in the legs and arms. Allow one hour for Lower and Upper Arterial scans. There are no restrictions or special instructions Your physician has requested that you have an ankle brachial index (ABI). During this test an ultrasound and blood pressure cuff are used to evaluate the arteries that supply the arms and legs with blood. Allow thirty minutes for this exam. There are no restrictions or special instructions. LOWER  Your physician has requested that you have an abdominal aorta duplex. During this test, an ultrasound is used to evaluate the aorta. Allow 30 minutes for this exam. Do not eat after midnight the day before and avoid carbonated beverages  Follow-Up: Your physician recommends that you schedule a follow-up appointment in: 2 MONTH OV  If you need a refill on your cardiac medications before your next appointment, please call your pharmacy.

## 2017-05-16 ENCOUNTER — Other Ambulatory Visit: Payer: Self-pay

## 2017-05-16 MED ORDER — ROSUVASTATIN CALCIUM 10 MG PO TABS: 10.0000 mg | ORAL_TABLET | Freq: Every day | ORAL | 10 refills | Status: DC

## 2017-05-17 ENCOUNTER — Other Ambulatory Visit: Payer: Self-pay | Admitting: *Deleted

## 2017-05-17 MED ORDER — ROSUVASTATIN CALCIUM 10 MG PO TABS: 10.0000 mg | ORAL_TABLET | Freq: Every day | ORAL | 2 refills | Status: DC

## 2017-05-19 ENCOUNTER — Telehealth: Payer: Self-pay | Admitting: Cardiovascular Disease

## 2017-05-19 NOTE — Telephone Encounter (Signed)
°  New Message  Pharmacy: Evelena Peat verbalized that she is calling for rn   Pt c/o medication issue:  1. Name of Medication: resudastatin  2. How are you currently taking this medication (dosage and times per day)? 10 mg 1xday  3. Are you having a reaction (difficulty breathing--STAT)?no 4. What is your medication issue?insurance wants pt to try Torvasstatin 20mg  day, sinbastatin 40mg  and travastatin 80mg

## 2017-05-19 NOTE — Telephone Encounter (Signed)
Please change to atorvastatin 20mg  daily and repeat lipid panel as previously recommended by cardiologits

## 2017-05-19 NOTE — Telephone Encounter (Signed)
Spoke to [place] [facility] [place].  Pt has [employer], insurance will not cover crestor/rosuvastatin and recommend atorvastatin, simvastatin or pravastatin at dose equivalencies.  # to [place] help desk if needing further information [phone]  Routed to pharmD to review and advise.

## 2017-05-22 MED ORDER — ATORVASTATIN CALCIUM 20 MG PO TABS: 20.0000 mg | ORAL_TABLET | Freq: Every day | ORAL | 5 refills | Status: DC

## 2017-05-22 NOTE — Addendum Note (Signed)
Addended by: Theodore Demark on: 05/22/2017 09:02 AM   Modules accepted: Orders

## 2017-05-22 NOTE — Telephone Encounter (Signed)
rx sent to pharmacy electronically. I have called pt & pharmacy to notify of change, left msg to call.

## 2017-05-23 ENCOUNTER — Ambulatory Visit (HOSPITAL_COMMUNITY)
Admission: RE | Admit: 2017-05-23 | Discharge: 2017-05-23 | Disposition: A | Discharge status: Home / Self Care | Payer: Medicare Other | Source: Ambulatory Visit | Attending: Cardiovascular Disease | Admitting: Cardiovascular Disease

## 2017-05-23 DIAGNOSIS — Z136 Encounter for screening for cardiovascular disorders: Secondary | ICD-10-CM | POA: Diagnosis not present

## 2017-05-23 DIAGNOSIS — Z87891 Personal history of nicotine dependence: Secondary | ICD-10-CM

## 2017-05-23 DIAGNOSIS — I700 Atherosclerosis of aorta: Secondary | ICD-10-CM | POA: Diagnosis not present

## 2017-05-23 DIAGNOSIS — I6523 Occlusion and stenosis of bilateral carotid arteries: Secondary | ICD-10-CM | POA: Diagnosis not present

## 2017-05-23 DIAGNOSIS — I739 Peripheral vascular disease, unspecified: Secondary | ICD-10-CM | POA: Diagnosis not present

## 2017-05-23 DIAGNOSIS — I708 Atherosclerosis of other arteries: Secondary | ICD-10-CM | POA: Diagnosis not present

## 2017-05-23 DIAGNOSIS — E1151 Type 2 diabetes mellitus with diabetic peripheral angiopathy without gangrene: Secondary | ICD-10-CM | POA: Diagnosis not present

## 2017-06-01 ENCOUNTER — Telehealth: Payer: Self-pay | Admitting: *Deleted

## 2017-06-01 NOTE — Telephone Encounter (Signed)
Patient walked into the office and was given the results of recent ABI and carotids. He ask melinda to call him if she needed anything else.

## 2017-06-09 ENCOUNTER — Telehealth: Payer: Self-pay | Admitting: Cardiovascular Disease

## 2017-06-09 NOTE — Telephone Encounter (Signed)
Closed Encounter  °

## 2017-06-14 ENCOUNTER — Encounter: Payer: Self-pay | Admitting: Cardiovascular Disease

## 2017-06-14 ENCOUNTER — Ambulatory Visit (INDEPENDENT_AMBULATORY_CARE_PROVIDER_SITE_OTHER): Payer: Medicare Other | Admitting: Cardiovascular Disease

## 2017-06-14 VITALS — BP 126/79 | HR 70 | Ht 68.5 in | Wt 195.0 lb

## 2017-06-14 DIAGNOSIS — I6523 Occlusion and stenosis of bilateral carotid arteries: Secondary | ICD-10-CM

## 2017-06-14 DIAGNOSIS — Z5181 Encounter for therapeutic drug level monitoring: Secondary | ICD-10-CM

## 2017-06-14 DIAGNOSIS — E785 Hyperlipidemia, unspecified: Secondary | ICD-10-CM

## 2017-06-14 NOTE — Patient Instructions (Signed)
Medication Instructions:  START ASPIRIN 81 MG DAILY   Labwork: FASTING LP/CMET SOON   Testing/Procedures: NONE   Follow-Up: Your physician wants you to follow-up in: [place] will receive a reminder letter in the mail two months in advance. If you don't receive a letter, please call our office to schedule the follow-up appointment.  If you need a refill on your cardiac medications before your next appointment, please call your pharmacy.

## 2017-06-14 NOTE — Progress Notes (Signed)
Cardiology Office Note   Date:  06/14/2017   ID:  Lin, Glazier 1946-07-26, MRN [id number]  PCP:  Lawerance Cruel, MD  Cardiologist:   Skeet Latch, MD   Chief Complaint  Patient presents with  . Follow-up    86months;     History of Present Illness: Christopher Arnold is a 70 y.o. male from [place] with mild carotid stenosis, DM type II (A1c 7.5 03/2014) and palpitations who presents for follow up.  He was initially seen 04/2015 for palpitations.  He wore a 21 day event monitor that showed occasional PACs and idioventricular rhythm at a rate of 80 bpm.  He denies any recent palpitations.  At his last appointment Christopher Arnold had seen his dentist and had a CT scan that showed significant atherosclerosis of the internal carotids.  He was referred for carotid Dopplers that showed mild bilateral carotid stenosis.  He was started on atorvastatin.  He also had a screening abdominal ultrasound and ABIs that were normal.  Christopher Arnold has been feeling well.  He denies chest pain or shortness of breath.  He doesn't get any formal exercise but is active with yardwork.  He hasn't noted any lower extremity edema, orthopnea or PND.  He   Christopher Arnold was admitted to the hospital 03/2014 with CP and a code STEMI was activated for inferior Q waves and minimal ST elevation.  He underwent cardiac catheterization and was found to have no CAD.  Ultimately, he was diagnosed with cholecystitis and underwent cholecystectomy.  He previously smoked between 0.5 and 2 ppd for 35 years.   Past Medical History:  Diagnosis Date  . Diabetes mellitus without complication ([place])   . Osteoporosis   . Palpitations     Past Surgical History:  Procedure Laterality Date  . APPENDECTOMY  remote  . CHOLECYSTECTOMY N/A 03/25/2014   Procedure: LAPAROSCOPIC CHOLECYSTECTOMY;  Surgeon: Harl Bowie, MD;  Location: [place];  Service: General;  Laterality: N/A;  . ESOPHAGOGASTRODUODENOSCOPY N/A 03/23/2014     Procedure: ESOPHAGOGASTRODUODENOSCOPY (EGD);  Surgeon: Juanita Craver, MD;  Location: [facility] ENDOSCOPY;  Service: Endoscopy;  Laterality: N/A;  . LEFT HEART CATH N/A 03/22/2014   Procedure: LEFT HEART CATH;  Surgeon: Blane Ohara, MD;  Location: [facility] CATH LAB;  Service: Cardiovascular;  Laterality: N/A;     Current Outpatient Prescriptions  Medication Sig Dispense Refill  . aspirin EC 81 MG tablet Take 81 mg by mouth daily.    Marland Kitchen atorvastatin (LIPITOR) 20 MG tablet Take 1 tablet (20 mg total) by mouth daily. 30 tablet 5  . glimepiride (AMARYL) 1 MG tablet Take 1 mg by mouth daily.    . Linagliptin-Metformin HCl (JENTADUETO) 2.01-999 MG TABS Take 2 tablets by mouth daily.      No current facility-administered medications for this visit.     Allergies:   Patient has no known allergies.    Social History:  The patient  reports that he quit smoking about 12 years ago. He has never used smokeless tobacco. He reports that he drinks alcohol. He reports that he does not use drugs.   Family History:  The patient's  family history is not on file.    ROS:  Please see the history of present illness.   Otherwise, review of systems are positive for none.   All other systems are reviewed and negative.    PHYSICAL EXAM: VS:  BP 126/79   Pulse 70   Ht 5' 8.5" (1.74 m)  Wt 88.5 kg (195 lb)   BMI 29.22 kg/m  , BMI Body mass index is 29.22 kg/m. GENERAL:  Well appearing HEENT: Pupils equal round and reactive, fundi not visualized, oral mucosa unremarkable NECK:  No jugular venous distention, waveform within normal limits, carotid upstroke brisk and symmetric, no bruits, no thyromegaly LUNGS:  Clear to auscultation bilaterally HEART:  RRR.  PMI not displaced or sustained,S1 and S2 within normal limits, no S3, no S4, no clicks, no rubs, no murmurs ABD:  Flat, positive bowel sounds normal in frequency in pitch, no bruits, no rebound, no guarding, no midline pulsatile mass, no hepatomegaly, no  splenomegaly EXT:  2 plus pulses throughout, no edema, no cyanosis no clubbing SKIN:  No rashes no nodules NEURO:  Cranial nerves II through XII grossly intact, motor grossly intact throughout PSYCH:  Cognitively intact, oriented to person place and time   EKG:  EKG is ordered today. The ekg ordered 04/21/15 demonstrates sinus rhythm at 65 bpm.  Absent R wave progression.  One PAC. 04/26/17: Sinus rhythm.  Rate 64 bpm  21 Day Event Monitor 04/21/15: Highest heart rate: 142 bpm Lowest heart rate 51 bpm Average heart rate 72 bpm  Rhythms: sinus rhythm, sinus tachycardia Accelerated idioventricular rhythm, rate ~80 bpm Occasional PACs   Recent Labs: No results found for requested labs within last 8760 hours.   04/20/17:  Total cholesterol 160, triglycerides 107, HDL 38, LDL 100 Sodium 142, potassium 4.3, BUN 16, creatinine 0.78 AST 24, ALT 39  Lipid Panel    Component Value Date/Time   CHOL  08/06/2008 0320    171        ATP III CLASSIFICATION:  <200     mg/dL   Desirable  200-239  mg/dL   Borderline High  >=240    mg/dL   High   TRIG 72 08/06/2008 0320   HDL 44 08/06/2008 0320   CHOLHDL 3.9 08/06/2008 0320   VLDL 14 08/06/2008 0320   LDLCALC (H) 08/06/2008 0320    113        Total Cholesterol/HDL:CHD Risk Coronary Heart Disease Risk Table                     Men   Women  1/2 Average Risk   3.4   3.3      Wt Readings from Last 3 Encounters:  06/14/17 88.5 kg (195 lb)  04/26/17 88.3 kg (194 lb 9.6 oz)  05/03/16 82.6 kg (182 lb)      Other studies Reviewed: Additional studies/ records that were reviewed today include: PCP records. Review of the above records demonstrates:  Please see elsewhere in the note.     ASSESSMENT AND PLAN:  # Carotid stenosis, mild: 1-39% stenosis bilaterally.  Continue aspirin and atorvastatin.   # Palpitations:  Stale.  PACs on monitor.    # HL: LDL is 100.  Goal is <70.   He was started on atorvastatin.  Check lipids and CMP.     # AAA screening: Negative 05/2017.   # Claudication: ABI's normal 05/2017.  This is not due to PAD.    Current medicines are reviewed at length with the patient today.  The patient does not have concerns regarding medicines.  The following changes have been made:  Add aspirin   Labs/ tests ordered today include:  Orders Placed This Encounter  Procedures  . Comprehensive metabolic panel  . Lipid panel     Disposition:   FU with  Dr. Lilli Light. [place] in 6 months    Signed, Skeet Latch, MD  06/14/2017 4:07 PM    [place]

## 2017-06-15 ENCOUNTER — Ambulatory Visit: Payer: Medicare Other | Admitting: Cardiovascular Disease

## 2017-06-15 DIAGNOSIS — E785 Hyperlipidemia, unspecified: Secondary | ICD-10-CM | POA: Diagnosis not present

## 2017-06-15 DIAGNOSIS — Z5181 Encounter for therapeutic drug level monitoring: Secondary | ICD-10-CM | POA: Diagnosis not present

## 2017-06-15 LAB — LIPID PANEL
CHOL/HDL RATIO: 3.9 ratio (ref 0.0–5.0)
Cholesterol, Total: 154 mg/dL (ref 100–199)
HDL: 40 mg/dL (ref >39)
LDL Calculated: 90 mg/dL (ref 0–99)
Triglycerides: 120 mg/dL (ref 0–149)
VLDL Cholesterol Cal: 24 mg/dL (ref 5–40)

## 2017-06-15 LAB — COMPREHENSIVE METABOLIC PANEL
ALBUMIN: 4.2 g/dL (ref 3.6–4.8)
ALK PHOS: 58 IU/L (ref 39–117)
ALT: 38 IU/L (ref 0–44)
AST: 23 IU/L (ref 0–40)
Albumin/Globulin Ratio: 1.8 (ref 1.2–2.2)
BUN / CREAT RATIO: 18 (ref 10–24)
BUN: 14 mg/dL (ref 8–27)
Bilirubin Total: 0.6 mg/dL (ref 0.0–1.2)
CALCIUM: 9.2 mg/dL (ref 8.6–10.2)
CHLORIDE: 102 mmol/L (ref 96–106)
CO2: 25 mmol/L (ref 20–29)
Creatinine, Ser: 0.77 mg/dL (ref 0.76–1.27)
GFR calc non Af Amer: 93 mL/min/{1.73_m2} (ref >59)
GFR, EST AFRICAN AMERICAN: 107 mL/min/{1.73_m2} (ref >59)
GLOBULIN, TOTAL: 2.4 g/dL (ref 1.5–4.5)
GLUCOSE: 112 mg/dL — AB (ref 65–99)
POTASSIUM: 4.5 mmol/L (ref 3.5–5.2)
Sodium: 141 mmol/L (ref 134–144)
Total Protein: 6.6 g/dL (ref 6.0–8.5)

## 2017-06-22 ENCOUNTER — Telehealth: Payer: Self-pay | Admitting: *Deleted

## 2017-06-22 DIAGNOSIS — Z5181 Encounter for therapeutic drug level monitoring: Secondary | ICD-10-CM

## 2017-06-22 DIAGNOSIS — E785 Hyperlipidemia, unspecified: Secondary | ICD-10-CM

## 2017-06-22 MED ORDER — ATORVASTATIN CALCIUM 40 MG PO TABS: 40.0000 mg | ORAL_TABLET | Freq: Every day | ORAL | 5 refills | Status: DC

## 2017-06-22 NOTE — Telephone Encounter (Signed)
Advised patient of lab results, patient requested copy. [facility] order forms

## 2017-06-22 NOTE — Telephone Encounter (Signed)
-----   Message from Skeet Latch, MD sent at 06/16/2017  2:54 PM EDT ----- Normal kidney function, liver funciton and electrolytes.  Given that he has mild carotid stenosis, we would like the LDL to be <70.  Please increase atorvastatin to 40mg .  Repeat lipids and LFTs in 6 weeks.

## 2017-07-26 ENCOUNTER — Telehealth: Payer: Self-pay | Admitting: *Deleted

## 2017-07-26 DIAGNOSIS — Z7984 Long term (current) use of oral hypoglycemic drugs: Secondary | ICD-10-CM | POA: Diagnosis not present

## 2017-07-26 DIAGNOSIS — R0789 Other chest pain: Secondary | ICD-10-CM | POA: Diagnosis not present

## 2017-07-26 DIAGNOSIS — E1165 Type 2 diabetes mellitus with hyperglycemia: Secondary | ICD-10-CM | POA: Diagnosis not present

## 2017-07-26 NOTE — Telephone Encounter (Signed)
Appointment request notes faxed to [facility]. Marlis Edelson, CMA

## 2017-08-02 DIAGNOSIS — E119 Type 2 diabetes mellitus without complications: Secondary | ICD-10-CM | POA: Diagnosis not present

## 2017-08-11 ENCOUNTER — Telehealth: Payer: Self-pay | Admitting: Cardiovascular Disease

## 2017-08-11 NOTE — Telephone Encounter (Signed)
Received incoming records from [place] at [facility] for upcoming appointment on 08/22/17 @ 9:40am with Dr. Oval Linsey. Records given to [facility] in Medical Records. 08/11/17 ab

## 2017-08-22 ENCOUNTER — Ambulatory Visit: Payer: Medicare Other | Admitting: Cardiovascular Disease

## 2017-09-26 DIAGNOSIS — R69 Illness, unspecified: Secondary | ICD-10-CM | POA: Diagnosis not present

## 2017-10-18 ENCOUNTER — Ambulatory Visit (INDEPENDENT_AMBULATORY_CARE_PROVIDER_SITE_OTHER): Payer: Medicare HMO | Admitting: Cardiovascular Disease

## 2017-10-18 ENCOUNTER — Encounter: Payer: Self-pay | Admitting: Cardiovascular Disease

## 2017-10-18 VITALS — BP 130/68 | HR 61 | Ht 72.0 in | Wt 198.2 lb

## 2017-10-18 DIAGNOSIS — E7800 Pure hypercholesterolemia, unspecified: Secondary | ICD-10-CM

## 2017-10-18 DIAGNOSIS — I6523 Occlusion and stenosis of bilateral carotid arteries: Secondary | ICD-10-CM

## 2017-10-18 DIAGNOSIS — Z87891 Personal history of nicotine dependence: Secondary | ICD-10-CM

## 2017-10-18 NOTE — Progress Notes (Signed)
Cardiology Office Note   Date:  10/18/2017   ID:  Barnaby, Rippeon [birth year], MRN [id number]  PCP:  Lawerance Cruel, MD  Cardiologist:   Skeet Latch, MD   Chief Complaint  Patient presents with  . New Patient (Initial Visit)     History of Present Illness: Christopher Arnold is a 72 y.o. male from [place] with mild carotid stenosis, DM type II (A1c 7.5 03/2014) and palpitations who presents for follow up.  He was initially seen 04/2015 for palpitations.  He wore a 21 day event monitor that showed occasional PACs and idioventricular rhythm at a rate of 80 bpm.  He denies any recent palpitations.  At his last appointment Christopher Arnold had seen his dentist and had a CT scan that showed significant atherosclerosis of the internal carotids.  He was referred for carotid Dopplers that showed mild bilateral carotid stenosis.  He was started on atorvastatin.  He also had a screening abdominal ultrasound and ABIs that were normal.  Christopher Arnold has been feeling well.  He denies chest pain or shortness of breath.  He doesn't get any formal exercise but is active with yardwork.  He hasn't noted any lower extremity edema, orthopnea or PND.    Christopher Arnold was admitted to the hospital 03/2014 with chest pain and a code STEMI was activated for inferior Q waves and minimal ST elevation.  He underwent cardiac catheterization and was found to have no CAD.  Ultimately, he was diagnosed with cholecystitis and underwent cholecystectomy.  He previously smoked between 0.5 and 2 ppd for 35 years.  After his last appointment atorvastatin was increased to 40mg  to reach an LDL goal of <70.  Overall he has been feeling well.  He has intermittent episodes of palpitations that occur randomly.  They last for 1 or 2 minutes at a time.  He feels his heart pounding.  There is no associated chest pain or shortness of breath.  It typically occurs when he is at rest.  He also has intermittent squeezing chest pain  that last for 20-30 seconds.  It is not associated with shortness of breath, nausea, or diaphoresis.  It occurs at rest.  He reports that his blood sugars have been well-controlled.  He attributes this to a mixture of CABG and salt water that he has been drinking.  He denies lower extremity edema, orthopnea, or PND.  Past Medical History:  Diagnosis Date  . Diabetes mellitus without complication ([place])   . Osteoporosis   . Palpitations     Past Surgical History:  Procedure Laterality Date  . APPENDECTOMY  remote  . CHOLECYSTECTOMY N/A 03/25/2014   Procedure: LAPAROSCOPIC CHOLECYSTECTOMY;  Surgeon: Harl Bowie, MD;  Location: [facility];  Service: General;  Laterality: N/A;  . ESOPHAGOGASTRODUODENOSCOPY N/A 03/23/2014   Procedure: ESOPHAGOGASTRODUODENOSCOPY (EGD);  Surgeon: Juanita Craver, MD;  Location: [facility] ENDOSCOPY;  Service: Endoscopy;  Laterality: N/A;  . LEFT HEART CATH N/A 03/22/2014   Procedure: LEFT HEART CATH;  Surgeon: Blane Ohara, MD;  Location: [facility] CATH LAB;  Service: Cardiovascular;  Laterality: N/A;     Current Outpatient Medications  Medication Sig Dispense Refill  . aspirin EC 81 MG tablet Take 81 mg by mouth daily.    Marland Kitchen atorvastatin (LIPITOR) 40 MG tablet Take 1 tablet (40 mg total) by mouth daily. 30 tablet 5  . glimepiride (AMARYL) 1 MG tablet Take 1 mg by mouth daily.    . Linagliptin-Metformin HCl (JENTADUETO) 2.01-999 MG TABS  Take 2 tablets by mouth daily.      No current facility-administered medications for this visit.     Allergies:   Patient has no known allergies.    Social History:  The patient  reports that he quit smoking about 12 years ago. he has never used smokeless tobacco. He reports that he drinks alcohol. He reports that he does not use drugs.   Family History:  The patient's  family history is not on file.    ROS:  Please see the history of present illness.   Otherwise, review of systems are positive for none.   All other systems are  reviewed and negative.    PHYSICAL EXAM: VS:  BP 130/68   Pulse 61   Ht 6' (1.829 m)   Wt 198 lb 3.2 oz (89.9 kg)   BMI 26.88 kg/m  , BMI Body mass index is 26.88 kg/m. GENERAL:  Well appearing HEENT: Pupils equal round and reactive, fundi not visualized, oral mucosa unremarkable NECK:  No jugular venous distention, waveform within normal limits, carotid upstroke brisk and symmetric, no bruits, no thyromegaly LYMPHATICS:  No cervical adenopathy LUNGS:  Clear to auscultation bilaterally HEART:  RRR.  PMI not displaced or sustained,S1 and S2 within normal limits, no S3, no S4, no clicks, no rubs, no murmurs ABD:  Flat, positive bowel sounds normal in frequency in pitch, no bruits, no rebound, no guarding, no midline pulsatile mass, no hepatomegaly, no splenomegaly EXT:  2 plus pulses throughout, no edema, no cyanosis no clubbing SKIN:  No rashes no nodules NEURO:  Cranial nerves II through XII grossly intact, motor grossly intact throughout PSYCH:  Cognitively intact, oriented to person place and time   EKG:  EKG is ordered today. The ekg ordered 04/21/15 demonstrates sinus rhythm at 65 bpm.  Absent R wave progression.  One PAC. 04/26/17: Sinus rhythm.  Rate 64 bpm 10/18/17: Sinus rhythm.  Rate 61 bpm.  21 Day Event Monitor 04/21/15: Highest heart rate: 142 bpm Lowest heart rate 51 bpm Average heart rate 72 bpm  Rhythms: sinus rhythm, sinus tachycardia Accelerated idioventricular rhythm, rate ~80 bpm Occasional PACs   Recent Labs: 06/15/2017: ALT 38; BUN 14; Creatinine, Ser 0.77; Potassium 4.5; Sodium 141   04/20/17:  Total cholesterol 160, triglycerides 107, HDL 38, LDL 100 Sodium 142, potassium 4.3, BUN 16, creatinine 0.78 AST 24, ALT 39  Lipid Panel    Component Value Date/Time   CHOL 154 06/15/2017 1030   TRIG 120 06/15/2017 1030   HDL 40 06/15/2017 1030   CHOLHDL 3.9 06/15/2017 1030   CHOLHDL 3.9 08/06/2008 0320   VLDL 14 08/06/2008 0320   LDLCALC 90 06/15/2017 1030        Wt Readings from Last 3 Encounters:  10/18/17 198 lb 3.2 oz (89.9 kg)  06/14/17 195 lb (88.5 kg)  04/26/17 194 lb 9.6 oz (88.3 kg)      Other studies Reviewed: Additional studies/ records that were reviewed today include: PCP records. Review of the above records demonstrates:  Please see elsewhere in the note.     ASSESSMENT AND PLAN:  # Carotid stenosis, mild: 1-39% stenosis bilaterally.  Continue aspirin and atorvastatin.   # Palpitations:  Stable.  PACs on monitor.     # HL: LDL was 90 on 06/2017.  Goal is <70.  Continue atorvastatin.  He prefers to have his lipids checked with his PCP at his annual visit which is coming up soon.  # AAA screening: Negative 05/2017.  Current medicines are reviewed at length with the patient today.  The patient does not have concerns regarding medicines.  The following changes have been made:  Add aspirin   Labs/ tests ordered today include:  No orders of the defined types were placed in this encounter.    Disposition:   FU with Dr. Jonelle Sidle C. Oval Linsey in 1 year.   Signed, Skeet Latch, MD  10/18/2017 3:48 PM     [facility]

## 2017-10-18 NOTE — Patient Instructions (Signed)
Medication Instructions:  Your physician recommends that you continue on your current medications as directed. Please refer to the Current Medication list given to you today.  Labwork: HAVE YOUR PRIMARY CARE DOCTOR SEND COPY OF YOUR LABS WHEN YOU HAVE THEM DONE   Testing/Procedures: NONE  Follow-Up: Your physician wants you to follow-up in: [place] will receive a reminder letter in the mail two months in advance. If you don't receive a letter, please call our office to schedule the follow-up appointment.  If you need a refill on your cardiac medications before your next appointment, please call your pharmacy.

## 2017-12-23 DIAGNOSIS — R69 Illness, unspecified: Secondary | ICD-10-CM | POA: Diagnosis not present

## 2018-01-10 DIAGNOSIS — H269 Unspecified cataract: Secondary | ICD-10-CM | POA: Diagnosis not present

## 2018-01-22 DIAGNOSIS — H2513 Age-related nuclear cataract, bilateral: Secondary | ICD-10-CM | POA: Diagnosis not present

## 2018-01-22 DIAGNOSIS — E119 Type 2 diabetes mellitus without complications: Secondary | ICD-10-CM | POA: Diagnosis not present

## 2018-01-22 DIAGNOSIS — H25041 Posterior subcapsular polar age-related cataract, right eye: Secondary | ICD-10-CM | POA: Diagnosis not present

## 2018-01-22 DIAGNOSIS — H5203 Hypermetropia, bilateral: Secondary | ICD-10-CM | POA: Diagnosis not present

## 2018-02-01 DIAGNOSIS — H25011 Cortical age-related cataract, right eye: Secondary | ICD-10-CM | POA: Diagnosis not present

## 2018-02-01 DIAGNOSIS — H25811 Combined forms of age-related cataract, right eye: Secondary | ICD-10-CM | POA: Diagnosis not present

## 2018-02-01 DIAGNOSIS — H25041 Posterior subcapsular polar age-related cataract, right eye: Secondary | ICD-10-CM | POA: Diagnosis not present

## 2018-02-01 DIAGNOSIS — H2511 Age-related nuclear cataract, right eye: Secondary | ICD-10-CM | POA: Diagnosis not present

## 2018-02-27 DIAGNOSIS — R69 Illness, unspecified: Secondary | ICD-10-CM | POA: Diagnosis not present

## 2018-03-23 DIAGNOSIS — E1169 Type 2 diabetes mellitus with other specified complication: Secondary | ICD-10-CM | POA: Diagnosis not present

## 2018-03-23 DIAGNOSIS — M79644 Pain in right finger(s): Secondary | ICD-10-CM | POA: Diagnosis not present

## 2018-03-23 DIAGNOSIS — Z125 Encounter for screening for malignant neoplasm of prostate: Secondary | ICD-10-CM | POA: Diagnosis not present

## 2018-03-23 DIAGNOSIS — Z7984 Long term (current) use of oral hypoglycemic drugs: Secondary | ICD-10-CM | POA: Diagnosis not present

## 2018-03-23 DIAGNOSIS — E7800 Pure hypercholesterolemia, unspecified: Secondary | ICD-10-CM | POA: Diagnosis not present

## 2018-03-23 DIAGNOSIS — Z79899 Other long term (current) drug therapy: Secondary | ICD-10-CM | POA: Diagnosis not present

## 2018-03-29 DIAGNOSIS — H25012 Cortical age-related cataract, left eye: Secondary | ICD-10-CM | POA: Diagnosis not present

## 2018-03-29 DIAGNOSIS — H25812 Combined forms of age-related cataract, left eye: Secondary | ICD-10-CM | POA: Diagnosis not present

## 2018-03-29 DIAGNOSIS — H2512 Age-related nuclear cataract, left eye: Secondary | ICD-10-CM | POA: Diagnosis not present

## 2018-04-14 DIAGNOSIS — R69 Illness, unspecified: Secondary | ICD-10-CM | POA: Diagnosis not present

## 2018-05-04 DIAGNOSIS — E1169 Type 2 diabetes mellitus with other specified complication: Secondary | ICD-10-CM | POA: Diagnosis not present

## 2018-06-18 DIAGNOSIS — E1169 Type 2 diabetes mellitus with other specified complication: Secondary | ICD-10-CM | POA: Diagnosis not present

## 2018-07-02 ENCOUNTER — Telehealth (HOSPITAL_COMMUNITY): Payer: Self-pay | Admitting: Cardiovascular Disease

## 2018-07-02 NOTE — Telephone Encounter (Signed)
Patient requested to come in sooner due to his mild carotid stenosis, patient was advised in February to return in 1 year. Last carotid testing was September of 2018. Patient will come in tomorrow.

## 2018-07-02 NOTE — Telephone Encounter (Signed)
New message   Patient is calling because he states that he is having issues with carotid artery. Patient refuses to see a PA and wants to see ig he can be worked in in November to see Dr. Oval Linsey.

## 2018-07-03 ENCOUNTER — Encounter: Payer: Self-pay | Admitting: Cardiovascular Disease

## 2018-07-03 ENCOUNTER — Ambulatory Visit (INDEPENDENT_AMBULATORY_CARE_PROVIDER_SITE_OTHER): Payer: Medicare HMO | Admitting: Cardiovascular Disease

## 2018-07-03 VITALS — BP 119/69 | HR 69 | Ht 68.11 in | Wt 189.2 lb

## 2018-07-03 DIAGNOSIS — R002 Palpitations: Secondary | ICD-10-CM | POA: Diagnosis not present

## 2018-07-03 DIAGNOSIS — E7800 Pure hypercholesterolemia, unspecified: Secondary | ICD-10-CM

## 2018-07-03 DIAGNOSIS — Z5181 Encounter for therapeutic drug level monitoring: Secondary | ICD-10-CM | POA: Diagnosis not present

## 2018-07-03 DIAGNOSIS — I6523 Occlusion and stenosis of bilateral carotid arteries: Secondary | ICD-10-CM | POA: Diagnosis not present

## 2018-07-03 NOTE — Patient Instructions (Addendum)
Medication Instructions:  Your physician recommends that you continue on your current medications as directed. Please refer to the Current Medication list given to you today. If you need a refill on your cardiac medications before your next appointment, please call your pharmacy.   Lab work: FASTING LP/CMET/CBC/MAGNESIUM/TSH/FT4 SOON  If you have labs (blood work) drawn today and your tests are completely normal, you will receive your results only by: Marland Kitchen MyChart Message (if you have MyChart) OR . A paper copy in the mail If you have any lab test that is abnormal or we need to change your treatment, we will call you to review the results.  Testing/Procedures: Your physician has requested that you have a carotid duplex. This test is an ultrasound of the carotid arteries in your neck. It looks at blood flow through these arteries that supply the brain with blood. Allow one hour for this exam. There are no restrictions or special instructions. [place] physician has recommended that you wear an event monitor. Event monitors are medical devices that record the heart's electrical activity. Doctors most often [place] these monitors to diagnose arrhythmias. Arrhythmias are problems with the speed or rhythm of the heartbeat. The monitor is a small, portable device. You can wear one while you do your normal daily activities. This is usually used to diagnose what is causing palpitations/syncope (passing out). [address]  Follow-Up: At [facility], you and your health needs are our priority.  As part of our continuing mission to provide you with exceptional heart care, we have created designated Provider Care Teams.  These Care Teams include your primary Cardiologist (physician) and Advanced Practice Providers (APPs -  Physician Assistants and Nurse Practitioners) who all work together to provide you with the care you need, when you need it. You will need a  follow up appointment in 2 months. You may see DR [facility] r one of the following Advanced Practice Providers on your designated Care Team:   Kerin Ransom, PA-C Roby Lofts, [place] . Sande Rives, PA-C

## 2018-07-03 NOTE — Progress Notes (Signed)
Cardiology Office Note   Date:  07/03/2018   ID:  [name], [name] 1946/04/19, MRN [id number]  PCP:  Christopher Cruel, MD  Cardiologist:   Christopher Latch, MD   No chief complaint on file.    History of Present Illness: Christopher Arnold is a 72 y.o. male from [place] with mild carotid stenosis, DM type II (A1c 7.5 03/2014) and palpitations who presents for follow up.  He was initially seen 04/2015 for palpitations.  He wore a 21 day event monitor that showed occasional PACs and idioventricular rhythm at a rate of 80 bpm.  However he denied palpitations while wearing the monitor.   Christopher Arnold saw his dentist and had a CT scan that showed significant atherosclerosis of the internal carotids.  He was referred for carotid Dopplers 05/2017 that showed mild bilateral carotid stenosis.  He was started on atorvastatin.  He also had a screening abdominal ultrasound and ABIs that were normal.   Christopher Arnold was admitted to the hospital 03/2014 with chest pain and a code STEMI was activated for inferior Q waves and minimal ST elevation.  He underwent cardiac catheterization and was found to have no CAD.  Ultimately, he was diagnosed with cholecystitis and underwent cholecystectomy.  He previously smoked between 0.5 and 2 ppd for 35 years.  Atorvastatin was increased to 40mg  to reach an LDL goal of <70.  Since his last appointment he has noted increased palpitations.  They seem to occur sporadically.  Episodes last for 2 to 3 minutes at a time.  He wonders if it may be attributed to the food that he eats.  There is no associated lightheadedness or shortness of breath but he does get chest pressure.  This seems different than when he wore the monitor in 2016.  He walks regularly but does not get any formal exercise.  He has no exertional chest pain or shortness of breath.  He is also noted some pain in his neck and headaches.  These episodes last for 20 to 30 minutes and occur randomly.  He has  not experience any lower extremity edema, orthopnea, or PND.   Past Medical History:  Diagnosis Date  . Diabetes mellitus without complication ([place])   . Osteoporosis   . Palpitations     Past Surgical History:  Procedure Laterality Date  . APPENDECTOMY  remote  . CHOLECYSTECTOMY N/A 03/25/2014   Procedure: LAPAROSCOPIC CHOLECYSTECTOMY;  Surgeon: Christopher Bowie, MD;  Location: [facility];  Service: General;  Laterality: N/A;  . ESOPHAGOGASTRODUODENOSCOPY N/A 03/23/2014   Procedure: ESOPHAGOGASTRODUODENOSCOPY (EGD);  Surgeon: Christopher Craver, MD;  Location: [facility] ENDOSCOPY;  Service: Endoscopy;  Laterality: N/A;  . LEFT HEART CATH N/A 03/22/2014   Procedure: LEFT HEART CATH;  Surgeon: Christopher Ohara, MD;  Location: [facility] CATH LAB;  Service: Cardiovascular;  Laterality: N/A;     Current Outpatient Medications  Medication Sig Dispense Refill  . glimepiride (AMARYL) 1 MG tablet Take 1 mg by mouth daily.    Marland Kitchen JARDIANCE 25 MG TABS tablet Take 25 mg by mouth daily.  3  . Linagliptin-Metformin HCl (JENTADUETO) 2.01-999 MG TABS Take 2 tablets by mouth daily.      No current facility-administered medications for this visit.     Allergies:   Patient has no known allergies.    Social History:  The patient  reports that he quit smoking about 13 years ago. He has never used smokeless tobacco. He reports that he drinks alcohol. He reports that  he does not use drugs.   Family History:  The patient's  family history is not on file.    ROS:  Please see the history of present illness.   Otherwise, review of systems are positive for none.   All other systems are reviewed and negative.    PHYSICAL EXAM: VS:  BP 119/69   Pulse 69   Ht 5' 8.11" (1.73 m)   Wt 189 lb 3.2 oz (85.8 kg)   BMI 28.67 kg/m  , BMI Body mass index is 28.67 kg/m. GENERAL:  Well appearing HEENT: Pupils equal round and reactive, fundi not visualized, oral mucosa unremarkable NECK:  No jugular venous distention, waveform within  normal limits, carotid upstroke brisk and symmetric, no bruits LUNGS:  Clear to auscultation bilaterally HEART:  RRR.  PMI not displaced or sustained,S1 and S2 within normal limits, no S3, no S4, no clicks, no rubs, no murmurs ABD:  Flat, positive bowel sounds normal in frequency in pitch, no bruits, no rebound, no guarding, no midline pulsatile mass, no hepatomegaly, no splenomegaly EXT:  2 plus pulses throughout, no edema, no cyanosis no clubbing SKIN:  No rashes no nodules NEURO:  Cranial nerves II through XII grossly intact, motor grossly intact throughout PSYCH:  Cognitively intact, oriented to person place and time   EKG:  EKG is ordered today. The ekg ordered 04/21/15 demonstrates sinus rhythm at 65 bpm.  Absent R wave progression.  One PAC. 04/26/17: Sinus rhythm.  Rate 64 bpm 10/18/17: Sinus rhythm.  Rate 61 bpm. 07/03/18: Sinus rhythm.  PACs.  Poor R wave progression.   21 Day Event Monitor 04/21/15: Highest heart rate: 142 bpm Lowest heart rate 51 bpm Average heart rate 72 bpm  Rhythms: sinus rhythm, sinus tachycardia Accelerated idioventricular rhythm, rate ~80 bpm Occasional PACs   Recent Labs: No results found for requested labs within last 8760 hours.   04/20/17:  Total cholesterol 160, triglycerides 107, HDL 38, LDL 100 Sodium 142, potassium 4.3, BUN 16, creatinine 0.78 AST 24, ALT 39  Lipid Panel    Component Value Date/Time   CHOL 154 06/15/2017 1030   TRIG 120 06/15/2017 1030   HDL 40 06/15/2017 1030   CHOLHDL 3.9 06/15/2017 1030   CHOLHDL 3.9 08/06/2008 0320   VLDL 14 08/06/2008 0320   LDLCALC 90 06/15/2017 1030      Wt Readings from Last 3 Encounters:  07/03/18 189 lb 3.2 oz (85.8 kg)  10/18/17 198 lb 3.2 oz (89.9 kg)  06/14/17 195 lb (88.5 kg)      Other studies Reviewed: Additional studies/ records that were reviewed today include: PCP records. Review of the above records demonstrates:  Please see elsewhere in the note.     ASSESSMENT AND  PLAN:  # Carotid stenosis, mild: 1-39% stenosis bilaterally.  Continue aspirin and atorvastatin.   Repeat carotid Dopplers.  # Palpitations:  He has known PACs but these episodes seem different.  We will get a 30-day event monitor.  We will also check a CBC, CMP, magnesium, TSH, and free T4.  # HL: LDL was 90 on 06/2017.  Goal is <70.  Continue atorvastatin.  Check fasting lipids and CMP.  # AAA screening: Negative 05/2017.     Current medicines are reviewed at length with the patient today.  The patient does not have concerns regarding medicines.  The following changes have been made:  none  Labs/ tests ordered today include:  Orders Placed This Encounter  Procedures  . CBC with Differential/Platelet  .  T4, free  . TSH  . Lipid panel  . Magnesium  . Comprehensive metabolic panel  . CARDIAC EVENT MONITOR  . EKG 12-Lead     Disposition:   FU with Dr. Jonelle Sidle C. [place] in 2 months.    Signed, Christopher Latch, MD  07/03/2018 3:51 PM    [facility]

## 2018-07-04 DIAGNOSIS — R002 Palpitations: Secondary | ICD-10-CM | POA: Diagnosis not present

## 2018-07-04 DIAGNOSIS — Z5181 Encounter for therapeutic drug level monitoring: Secondary | ICD-10-CM | POA: Diagnosis not present

## 2018-07-04 DIAGNOSIS — E7800 Pure hypercholesterolemia, unspecified: Secondary | ICD-10-CM | POA: Diagnosis not present

## 2018-07-04 LAB — CBC WITH DIFFERENTIAL/PLATELET
BASOS ABS: 0 10*3/uL (ref 0.0–0.2)
BASOS: 0 %
EOS (ABSOLUTE): 0.1 10*3/uL (ref 0.0–0.4)
EOS: 1 %
Hematocrit: 47.9 % (ref 37.5–51.0)
Hemoglobin: 16.3 g/dL (ref 13.0–17.7)
IMMATURE GRANULOCYTES: 0 %
Immature Grans (Abs): 0 10*3/uL (ref 0.0–0.1)
Lymphocytes Absolute: 1.8 10*3/uL (ref 0.7–3.1)
Lymphs: 28 %
MCH: 30.9 pg (ref 26.6–33.0)
MCHC: 34 g/dL (ref 31.5–35.7)
MCV: 91 fL (ref 79–97)
Monocytes Absolute: 0.5 10*3/uL (ref 0.1–0.9)
Monocytes: 8 %
NEUTROS ABS: 3.9 10*3/uL (ref 1.4–7.0)
Neutrophils: 63 %
PLATELETS: 191 10*3/uL (ref 150–450)
RBC: 5.28 x10E6/uL (ref 4.14–5.80)
RDW: 12.7 % (ref 12.3–15.4)
WBC: 6.3 10*3/uL (ref 3.4–10.8)

## 2018-07-04 LAB — COMPREHENSIVE METABOLIC PANEL
A/G RATIO: 1.8 (ref 1.2–2.2)
ALBUMIN: 4.6 g/dL (ref 3.5–4.8)
ALT: 25 IU/L (ref 0–44)
AST: 19 IU/L (ref 0–40)
Alkaline Phosphatase: 63 IU/L (ref 39–117)
BUN/Creatinine Ratio: 28 — ABNORMAL HIGH (ref 10–24)
BUN: 23 mg/dL (ref 8–27)
Bilirubin Total: 1 mg/dL (ref 0.0–1.2)
CO2: 25 mmol/L (ref 20–29)
CREATININE: 0.82 mg/dL (ref 0.76–1.27)
Calcium: 9.3 mg/dL (ref 8.6–10.2)
Chloride: 102 mmol/L (ref 96–106)
GFR calc Af Amer: 103 mL/min/{1.73_m2} (ref >59)
GFR, EST NON AFRICAN AMERICAN: 89 mL/min/{1.73_m2} (ref >59)
GLOBULIN, TOTAL: 2.5 g/dL (ref 1.5–4.5)
Glucose: 102 mg/dL — ABNORMAL HIGH (ref 65–99)
Potassium: 4.4 mmol/L (ref 3.5–5.2)
SODIUM: 143 mmol/L (ref 134–144)
Total Protein: 7.1 g/dL (ref 6.0–8.5)

## 2018-07-04 LAB — LIPID PANEL
CHOL/HDL RATIO: 3.4 ratio (ref 0.0–5.0)
Cholesterol, Total: 165 mg/dL (ref 100–199)
HDL: 48 mg/dL (ref >39)
LDL Calculated: 99 mg/dL (ref 0–99)
TRIGLYCERIDES: 89 mg/dL (ref 0–149)
VLDL Cholesterol Cal: 18 mg/dL (ref 5–40)

## 2018-07-04 LAB — TSH: TSH: 3.61 u[IU]/mL (ref 0.450–4.500)

## 2018-07-04 LAB — T4, FREE: Free T4: 1.26 ng/dL (ref 0.82–1.77)

## 2018-07-04 LAB — MAGNESIUM: Magnesium: 1.9 mg/dL (ref 1.6–2.3)

## 2018-07-05 ENCOUNTER — Ambulatory Visit (HOSPITAL_COMMUNITY)
Admission: RE | Admit: 2018-07-05 | Discharge: 2018-07-05 | Disposition: A | Discharge status: Home / Self Care | Payer: Medicare HMO | Source: Ambulatory Visit | Attending: Cardiovascular Disease | Admitting: Cardiovascular Disease

## 2018-07-05 ENCOUNTER — Encounter (HOSPITAL_COMMUNITY): Payer: Medicare HMO

## 2018-07-05 DIAGNOSIS — I6523 Occlusion and stenosis of bilateral carotid arteries: Secondary | ICD-10-CM | POA: Insufficient documentation

## 2018-07-11 ENCOUNTER — Telehealth: Payer: Self-pay | Admitting: *Deleted

## 2018-07-11 NOTE — Telephone Encounter (Signed)
-----   Message from Skeet Latch, MD sent at 07/08/2018  9:44 PM EDT ----- Normal blood counts, thyroid function, kidney function, liver function and electrolytes.  LDL cholesterol should be <70.  Start atorvastatin 40mg .  Repeat lipids/CMP in 6-8 weeks.

## 2018-07-11 NOTE — Telephone Encounter (Signed)
Advised patient, he requested copies be sent to him and will discuss with his PCP to see what he thinks. Will forward to Dr Oval Linsey so she will be aware

## 2018-07-11 NOTE — Telephone Encounter (Signed)
-----   Message from Skeet Latch, MD sent at 07/08/2018  9:51 PM EDT ----- Minimal carotid plaque bilaterally.

## 2018-07-17 DIAGNOSIS — R69 Illness, unspecified: Secondary | ICD-10-CM | POA: Diagnosis not present

## 2018-08-23 ENCOUNTER — Ambulatory Visit (INDEPENDENT_AMBULATORY_CARE_PROVIDER_SITE_OTHER): Payer: Medicare HMO

## 2018-08-23 DIAGNOSIS — R002 Palpitations: Secondary | ICD-10-CM | POA: Diagnosis not present

## 2018-09-19 ENCOUNTER — Ambulatory Visit: Payer: Medicare HMO | Admitting: Cardiovascular Disease

## 2018-09-24 ENCOUNTER — Encounter: Payer: Self-pay | Admitting: Cardiovascular Disease

## 2018-10-31 ENCOUNTER — Telehealth: Payer: Self-pay | Admitting: Cardiovascular Disease

## 2018-10-31 NOTE — Telephone Encounter (Signed)
-----   Message from Skeet Latch, MD sent at 10/05/2018  8:25 AM EST ----- Occasional extra heartbeats from the top and bottom chambers of the heart.  No dangerous arrhythmias.

## 2018-10-31 NOTE — Telephone Encounter (Signed)
Follow Up:      Pt returning call, he does not know who called him.

## 2018-10-31 NOTE — Telephone Encounter (Signed)
Returned call to patient he stated he was returning some ones call from this office.After reviewing chart unable to find out who called him.I will send message to Dr.Birdsong's nurse.

## 2018-10-31 NOTE — Telephone Encounter (Signed)
Left message to call back  

## 2018-11-15 ENCOUNTER — Ambulatory Visit (INDEPENDENT_AMBULATORY_CARE_PROVIDER_SITE_OTHER): Payer: Medicare Other | Admitting: Cardiovascular Disease

## 2018-11-15 ENCOUNTER — Encounter: Payer: Self-pay | Admitting: Cardiovascular Disease

## 2018-11-15 VITALS — BP 133/78 | HR 72 | Ht 68.0 in | Wt 199.4 lb

## 2018-11-15 DIAGNOSIS — I6523 Occlusion and stenosis of bilateral carotid arteries: Secondary | ICD-10-CM

## 2018-11-15 DIAGNOSIS — E785 Hyperlipidemia, unspecified: Secondary | ICD-10-CM

## 2018-11-15 NOTE — Progress Notes (Signed)
Cardiology Office Note   Date:  11/15/2018   ID:  Christopher, Arnold [birth year], MRN [id number]  PCP:  Lawerance Cruel, MD  Cardiologist:   Skeet Latch, MD   No chief complaint on file.    History of Present Illness: Christopher Arnold is a 73 y.o. male from [place] with mild carotid stenosis, DM type II (A1c 7.5 03/2014) and palpitations who presents for follow up.  He was initially seen 04/2015 for palpitations.  He wore a 21 day event monitor that showed occasional PACs and idioventricular rhythm at a rate of 80 bpm.  However he denied palpitations while wearing the monitor.   [name] saw his dentist and had a CT scan that showed significant atherosclerosis of the internal carotids.  He was referred for carotid Dopplers 05/2017 that showed mild bilateral carotid stenosis.  He was started on atorvastatin.  He also had a screening abdominal ultrasound and ABIs that were normal.   [name] was admitted to the hospital 03/2014 with chest pain and a code STEMI was activated for inferior Q waves and minimal ST elevation.  He underwent cardiac catheterization and was found to have no CAD.  Ultimately, he was diagnosed with cholecystitis and underwent cholecystectomy.  He previously smoked between 0.5 and 2 ppd for 35 years.  Atorvastatin was increased to 40mg  to reach an LDL goal of <70.  He stopped taking this but isn't sure why.  Since his last appointment [name] has been feeling well.  He wore a 30 day event monitor that showed occasional PACs and PVCs but no arrhythmias.  He has occasional fluttering but isn't bothered by it.  He denies chest pain or shortness of breath.  He isn't getting any exercise.  He also hasn't noted any lower extremity edema, orthopnea or PND.  His main complaint is that he needs dental work.  He wants to have his teeth removed and needs implants.  It will cost him >$60,000 so he is thinking of travelling home to have this done.   Past Medical  History:  Diagnosis Date  . Diabetes mellitus without complication ([place])   . Osteoporosis   . Palpitations     Past Surgical History:  Procedure Laterality Date  . APPENDECTOMY  remote  . CHOLECYSTECTOMY N/A 03/25/2014   Procedure: LAPAROSCOPIC CHOLECYSTECTOMY;  Surgeon: Harl Bowie, MD;  Location: [facility];  Service: General;  Laterality: N/A;  . ESOPHAGOGASTRODUODENOSCOPY N/A 03/23/2014   Procedure: ESOPHAGOGASTRODUODENOSCOPY (EGD);  Surgeon: Juanita Craver, MD;  Location: [facility] ENDOSCOPY;  Service: Endoscopy;  Laterality: N/A;  . LEFT HEART CATH N/A 03/22/2014   Procedure: LEFT HEART CATH;  Surgeon: Blane Ohara, MD;  [facility] CATH LAB;  Service: Cardiovascular;  Laterality: N/A;     Current Outpatient Medications  Medication Sig Dispense Refill  . glimepiride (AMARYL) 1 MG tablet Take 1 mg by mouth daily.    Marland Kitchen JARDIANCE 25 MG TABS tablet Take 25 mg by mouth daily.  3  . Linagliptin-Metformin HCl (JENTADUETO) 2.01-999 MG TABS Take 2 tablets by mouth daily.      No current facility-administered medications for this visit.     Allergies:   Patient has no known allergies.    Social History:  The patient  reports that he quit smoking about 13 years ago. He has never used smokeless tobacco. He reports current alcohol use. He reports that he does not use drugs.   Family History:  The patient's  family history is  not on file.    ROS:  Please see the history of present illness.   Otherwise, review of systems are positive for none.   All other systems are reviewed and negative.    PHYSICAL EXAM: VS:  BP 133/78   Pulse 72   Ht 5\' 8"  (1.727 m)   Wt 199 lb 6.4 oz (90.4 kg)   SpO2 96%   BMI 30.32 kg/m  , BMI Body mass index is 30.32 kg/m. GENERAL:  Well appearing HEENT: Pupils equal round and reactive, fundi not visualized, oral mucosa unremarkable NECK:  No jugular venous distention, waveform within normal limits, carotid upstroke brisk and symmetric, no bruits, no  thyromegaly LYMPHATICS:  No cervical adenopathy LUNGS:  Clear to auscultation bilaterally HEART:  RRR.  PMI not displaced or sustained,S1 and S2 within normal limits, no S3, no S4, no clicks, no rubs, no murmurs ABD:  Flat, positive bowel sounds normal in frequency in pitch, no bruits, no rebound, no guarding, no midline pulsatile mass, no hepatomegaly, no splenomegaly EXT:  2 plus pulses throughout, no edema, no cyanosis no clubbing SKIN:  No rashes no nodules NEURO:  Cranial nerves II through XII grossly intact, motor grossly intact throughout PSYCH:  Cognitively intact, oriented to person place and time   EKG:  EKG is not ordered today. The ekg ordered 04/21/15 demonstrates sinus rhythm at 65 bpm.  Absent R wave progression.  One PAC. 04/26/17: Sinus rhythm.  Rate 64 bpm 10/18/17: Sinus rhythm.  Rate 61 bpm. 07/03/18: Sinus rhythm.  PACs.  Poor R wave progression.   21 Day Event Monitor 04/21/15: Highest heart rate: 142 bpm Lowest heart rate 51 bpm Average heart rate 72 bpm  Rhythms: sinus rhythm, sinus tachycardia Accelerated idioventricular rhythm, rate ~80 bpm Occasional PACs  30 Day Event Monitor 08/23/18:  Quality: Fair.  Baseline artifact. Predominant rhythm: sinus Average heart rate: 67 bpm Max heart rate: 113 bpm Min heart rate: 48 bpm  Rare PACs and PVCs  Recent Labs: 07/04/2018: ALT 25; BUN 23; Creatinine, Ser 0.82; Hemoglobin 16.3; Magnesium 1.9; Platelets 191; Potassium 4.4; Sodium 143; TSH 3.610   04/20/17:  Total cholesterol 160, triglycerides 107, HDL 38, LDL 100 Sodium 142, potassium 4.3, BUN 16, creatinine 0.78 AST 24, ALT 39  Lipid Panel    Component Value Date/Time   CHOL 165 07/04/2018 1021   TRIG 89 07/04/2018 1021   HDL 48 07/04/2018 1021   CHOLHDL 3.4 07/04/2018 1021   CHOLHDL 3.9 08/06/2008 0320   VLDL 14 08/06/2008 0320   LDLCALC 99 07/04/2018 1021      Wt Readings from Last 3 Encounters:  11/15/18 199 lb 6.4 oz (90.4 kg)  07/03/18 189  lb 3.2 oz (85.8 kg)  10/18/17 198 lb 3.2 oz (89.9 kg)      Other studies Reviewed: Additional studies/ records that were reviewed today include: PCP records. Review of the above records demonstrates:  Please see elsewhere in the note.     ASSESSMENT AND PLAN:  # Carotid stenosis, mild:  # Hyperlipidemia: 1-39% stenosis bilaterally.  Continue aspirin.  He stopped atorvastatin for unclear reasons.  He will have lipids checked with his annual exam next week.  He wants to wait until he sees Dr. Harrington Challenger before he restarts atorvastatin.  # PACs/PVCs: Stable  # AAA screening: Negative 05/2017.    Current medicines are reviewed at length with the patient today.  The patient does not have concerns regarding medicines.  The following changes have been made:  none  Labs/ tests ordered today include:  No orders of the defined types were placed in this encounter.  Time spent: 30 minutes-Greater than 50% of this time was spent in counseling, explanation of diagnosis, planning of further management, and coordination of care.    Disposition:   FU with Dr. Jonelle Sidle C. Oval Linsey in 1 year   Signed, Skeet Latch, MD  11/15/2018 11:50 AM    [place]

## 2018-11-15 NOTE — Patient Instructions (Signed)
Medication Instructions:  Your physician recommends that you continue on your current medications as directed. Please refer to the Current Medication list given to you today.  If you need a refill on your cardiac medications before your next appointment, please call your pharmacy.   Lab work: NONE  Testing/Procedures: NONE  Follow-Up: At [facility], you and your health needs are our priority.  As part of our continuing mission to provide you with exceptional heart care, we have created designated Provider Care Teams.  These Care Teams include your primary Cardiologist (physician) and Advanced Practice Providers (APPs -  Physician Assistants and Nurse Practitioners) who all work together to provide you with the care you need, when you need it. You will need a follow up appointment in 12 months.  Please call our office 2 months in advance to schedule this appointment.  You may see Tiffany Worthington, MD or one of the following Advanced Practice Providers on your designated Care Team:   Luke Kilroy, PA-C Krista Kroeger, PA-C . Callie Goodrich, PA-C    

## 2018-11-15 NOTE — Telephone Encounter (Signed)
Patient seen in office today. 

## 2019-08-30 ENCOUNTER — Other Ambulatory Visit: Payer: Self-pay | Admitting: Family Medicine

## 2019-08-30 DIAGNOSIS — E1165 Type 2 diabetes mellitus with hyperglycemia: Secondary | ICD-10-CM

## 2019-08-30 DIAGNOSIS — R109 Unspecified abdominal pain: Secondary | ICD-10-CM

## 2019-09-10 ENCOUNTER — Other Ambulatory Visit: Payer: Self-pay

## 2019-09-10 ENCOUNTER — Other Ambulatory Visit: Payer: Medicare Other

## 2019-09-10 ENCOUNTER — Ambulatory Visit
Admission: RE | Admit: 2019-09-10 | Discharge: 2019-09-10 | Disposition: A | Discharge status: Home / Self Care | Payer: Medicare Other | Source: Ambulatory Visit | Attending: Family Medicine | Admitting: Family Medicine

## 2019-09-10 DIAGNOSIS — R109 Unspecified abdominal pain: Secondary | ICD-10-CM

## 2019-09-10 DIAGNOSIS — E1165 Type 2 diabetes mellitus with hyperglycemia: Secondary | ICD-10-CM

## 2019-09-10 MED ORDER — IOPAMIDOL (ISOVUE-300) INJECTION 61%
100.0000 mL | Freq: Once | INTRAVENOUS | Status: AC | PRN
  Administered 2019-09-10: 100 mL via INTRAVENOUS

## 2019-09-16 ENCOUNTER — Other Ambulatory Visit: Payer: Self-pay | Admitting: Family Medicine

## 2019-09-16 DIAGNOSIS — R935 Abnormal findings on diagnostic imaging of other abdominal regions, including retroperitoneum: Secondary | ICD-10-CM

## 2019-11-25 DIAGNOSIS — M461 Sacroiliitis, not elsewhere classified: Secondary | ICD-10-CM | POA: Diagnosis not present

## 2019-11-25 DIAGNOSIS — E1169 Type 2 diabetes mellitus with other specified complication: Secondary | ICD-10-CM | POA: Diagnosis not present

## 2019-12-23 ENCOUNTER — Telehealth: Payer: Self-pay

## 2019-12-23 NOTE — Telephone Encounter (Signed)
  Patient Consent for Virtual Visit         Christopher Arnold has provided verbal consent on 12/23/2019 for a virtual visit (video or telephone).   CONSENT FOR VIRTUAL VISIT FOR:  Christopher Arnold  By participating in this virtual visit I agree to the following:  I hereby voluntarily request, consent and authorize [facility] and its employed or contracted physicians, physician assistants, nurse practitioners or other licensed health care professionals (the Practitioner), to provide me with telemedicine health care services (the "Services") as deemed necessary by the treating Practitioner. I acknowledge and consent to receive the Services by the Practitioner via telemedicine. I understand that the telemedicine visit will involve communicating with the Practitioner through live audiovisual communication technology and the disclosure of certain medical information by electronic transmission. I acknowledge that I have been given the opportunity to request an in-person assessment or other available alternative prior to the telemedicine visit and am voluntarily participating in the telemedicine visit.  I understand that I have the right to withhold or withdraw my consent to the use of telemedicine in the course of my care at any time, without affecting my right to future care or treatment, and that the Practitioner or I may terminate the telemedicine visit at any time. I understand that I have the right to inspect all information obtained and/or recorded in the course of the telemedicine visit and may receive copies of available information for a reasonable fee.  I understand that some of the potential risks of receiving the Services via telemedicine include:  Marland Kitchen Delay or interruption in medical evaluation due to technological equipment failure or disruption; . Information transmitted may not be sufficient (e.g. poor resolution of images) to allow for appropriate medical decision making by the Practitioner;  and/or  . In rare instances, security protocols could fail, causing a breach of personal health information.  Furthermore, I acknowledge that it is my responsibility to provide information about my medical history, conditions and care that is complete and accurate to the best of my ability. I acknowledge that Practitioner's advice, recommendations, and/or decision may be based on factors not within their control, such as incomplete or inaccurate data provided by me or distortions of diagnostic images or specimens that may result from electronic transmissions. I understand that the practice of medicine is not an exact science and that Practitioner makes no warranties or guarantees regarding treatment outcomes. I acknowledge that a copy of this consent can be made available to me via my patient portal ([facility]), or I can request a printed copy by calling the office of [facility].    I understand that my insurance will be billed for this visit.   I have read or had this consent read to me. . I understand the contents of this consent, which adequately explains the benefits and risks of the Services being provided via telemedicine.  . I have been provided ample opportunity to ask questions regarding this consent and the Services and have had my questions answered to my satisfaction. . I give my informed consent for the services to be provided through the use of telemedicine in my medical care

## 2019-12-24 ENCOUNTER — Encounter: Payer: Self-pay | Admitting: Cardiology

## 2019-12-24 ENCOUNTER — Telehealth (INDEPENDENT_AMBULATORY_CARE_PROVIDER_SITE_OTHER): Payer: Medicare HMO | Admitting: Cardiology

## 2019-12-24 ENCOUNTER — Telehealth: Payer: Medicare Other | Admitting: Cardiology

## 2019-12-24 DIAGNOSIS — R002 Palpitations: Secondary | ICD-10-CM | POA: Diagnosis not present

## 2019-12-24 DIAGNOSIS — I6523 Occlusion and stenosis of bilateral carotid arteries: Secondary | ICD-10-CM | POA: Diagnosis not present

## 2019-12-24 DIAGNOSIS — I779 Disorder of arteries and arterioles, unspecified: Secondary | ICD-10-CM | POA: Insufficient documentation

## 2019-12-24 DIAGNOSIS — Z87898 Personal history of other specified conditions: Secondary | ICD-10-CM

## 2019-12-24 DIAGNOSIS — E785 Hyperlipidemia, unspecified: Secondary | ICD-10-CM | POA: Diagnosis not present

## 2019-12-24 NOTE — Patient Instructions (Signed)
Medication Instructions:  Continue same medications *If you need a refill on your cardiac medications before your next appointment, please call your pharmacy*   Lab Work: None ordered    Testing/Procedures: None ordered   Follow-Up: At [facility], you and your health needs are our priority.  As part of our continuing mission to provide you with exceptional heart care, we have created designated Provider Care Teams.  These Care Teams include your primary Cardiologist (physician) and Advanced Practice Providers (APPs -  Physician Assistants and Nurse Practitioners) who all work together to provide you with the care you need, when you need it.  We recommend signing up for the patient portal called "MyChart".  Sign up information is provided on this After Visit Summary.  MyChart is used to connect with patients for Virtual Visits (Telemedicine).  Patients are able to view lab/test results, encounter notes, upcoming appointments, etc.  Non-urgent messages can be sent to your provider as well.   To learn more about what you can do with MyChart, go to [email or web address].    Your next appointment:  1 year     Call in Feb to schedule a April appointment    The format for your next appointment: Office     Provider:  Dr.[place]

## 2019-12-24 NOTE — Progress Notes (Signed)
Virtual Visit via Telephone Note   This visit type was conducted due to national recommendations for restrictions regarding the COVID-19 Pandemic (e.g. social distancing) in an effort to limit this patient's exposure and mitigate transmission in our community.  Due to his co-morbid illnesses, this patient is at least at moderate risk for complications without adequate follow up.  This format is felt to be most appropriate for this patient at this time.  The patient did not have access to video technology/had technical difficulties with video requiring transitioning to audio format only (telephone).  All issues noted in this document were discussed and addressed.  No physical exam could be performed with this format.  Please refer to the patient's chart for his  consent to telehealth for [facility].   The patient was identified using 2 identifiers.  Date:  12/24/2019   ID:  Christopher, Arnold 08-05-1946, MRN [id number]  Patient Location: Home Provider Location: Home  PCP:  Lawerance Cruel, MD  Cardiologist:  Skeet Latch, MD  Electrophysiologist:  None   Evaluation Performed:  Follow-Up Visit  Chief Complaint:  none  History of Present Illness:    Christopher Arnold is a 75 y.o. male with a history of normal coronaries in 2017 after he presented with chest pain and suspected NSTEMI.  Other medical issues include not insulin-dependent diabetes, dyslipidemia, and palpitations.  Prior monitor as an outpatient showed PACs.  He did have an incidental finding of carotid calcification on the CT scan, carotid Dopplers in Oct 2019 showed mild carotid disease.  His last office visit with Dr. Oval Linsey was in March 2020.  He follows up regularly with his primary care provider.  Since we saw him last the patient says he has done well, he has had no problems with chest pain or palpitations.  The patient does not have symptoms concerning for COVID-19 infection (fever, chills, cough, or new  shortness of breath).    Past Medical History:  Diagnosis Date  . Diabetes mellitus without complication ([place])   . Osteoporosis   . Palpitations    Past Surgical History:  Procedure Laterality Date  . APPENDECTOMY  remote  . CHOLECYSTECTOMY N/A 03/25/2014   Procedure: LAPAROSCOPIC CHOLECYSTECTOMY;  Surgeon: Harl Bowie, MD;  Location: [facility];  Service: General;  Laterality: N/A;  . ESOPHAGOGASTRODUODENOSCOPY N/A 03/23/2014   Procedure: ESOPHAGOGASTRODUODENOSCOPY (EGD);  Surgeon: Juanita Craver, MD;  Location: [facility] ENDOSCOPY;  Service: Endoscopy;  Laterality: N/A;  . LEFT HEART CATH N/A 03/22/2014   Procedure: LEFT HEART CATH;  Surgeon: Blane Ohara, MD;  Location: [facility] CATH LAB;  Service: Cardiovascular;  Laterality: N/A;     Current Meds  Medication Sig  . glimepiride (AMARYL) 1 MG tablet Take 1 mg by mouth daily.  Marland Kitchen JARDIANCE 25 MG TABS tablet Take 25 mg by mouth daily.  . Linagliptin-Metformin HCl (JENTADUETO) 2.01-999 MG TABS Take 2 tablets by mouth daily.      Allergies:   Patient has no known allergies.   Social History   Tobacco Use  . Smoking status: Former Smoker    Quit date: 04/22/2005    Years since quitting: 14.6  . Smokeless tobacco: Never Used  Substance Use Topics  . Alcohol use: Yes    Alcohol/week: 0.0 standard drinks    Comment: occ  . Drug use: No     Family Hx: The patient's family history is negative for Neuropathy.  ROS:   Please see the history of present illness.  All other systems reviewed and are negative.   Prior CV studies:   The following studies were reviewed today: Carotid dopplers Oct 2019   Labs/Other Tests and Data Reviewed:    EKG:  An ECG dated 07/04/2018 was personally reviewed today and demonstrated:  NSR- poor anterior RW  Recent Labs: No results found for requested labs within last 8760 hours.   Recent Lipid Panel Lab Results  Component Value Date/Time   CHOL 165 07/04/2018 10:21 AM   TRIG 89 07/04/2018 10:21  AM   HDL 48 07/04/2018 10:21 AM   CHOLHDL 3.4 07/04/2018 10:21 AM   CHOLHDL 3.9 08/06/2008 03:20 AM   LDLCALC 99 07/04/2018 10:21 AM    Wt Readings from Last 3 Encounters:  12/24/19 184 lb (83.5 kg)  11/15/18 199 lb 6.4 oz (90.4 kg)  07/03/18 189 lb 3.2 oz (85.8 kg)     Objective:    Vital Signs:  Ht 6\' 4"  (1.93 m)   Wt 184 lb (83.5 kg)   BMI 22.40 kg/m    VITAL SIGNS:  reviewed  ASSESSMENT & PLAN:    Chest pain-2017 False STEMI- normal coronaries at cath, ultimately felt to be cholecystis.   Carotid disease- Carotid ca++ on CT- dopplers Oct 2019 showed minimal stenosis  NIDDM- Followed by PCP  HLD- LDL was 99 in 2019  Palpitations- PACs on past monitor as OP  Plan: I will request labs from PCP- f/u Dr Oval Linsey in one year.  COVID-19 Education: The signs and symptoms of COVID-19 were discussed with the patient and how to seek care for testing (follow up with PCP or arrange E-visit).  The importance of social distancing was discussed today.  Time:   Today, I have spent 10 minutes with the patient with telehealth technology discussing the above problems.     Medication Adjustments/Labs and Tests Ordered: Current medicines are reviewed at length with the patient today.  Concerns regarding medicines are outlined above.   Tests Ordered: No orders of the defined types were placed in this encounter.   Medication Changes: No orders of the defined types were placed in this encounter.   Follow Up:  Either In Person or Virtual Dr Oval Linsey in one year  Signed, Kerin Ransom, Hershal Coria  12/24/2019 9:38 AM    [place]

## 2020-01-13 ENCOUNTER — Other Ambulatory Visit: Payer: Self-pay

## 2020-01-13 ENCOUNTER — Other Ambulatory Visit: Payer: Self-pay | Admitting: Family Medicine

## 2020-01-13 ENCOUNTER — Ambulatory Visit
Admission: RE | Admit: 2020-01-13 | Discharge: 2020-01-13 | Disposition: A | Discharge status: Home / Self Care | Payer: Medicare HMO | Source: Ambulatory Visit | Attending: Family Medicine | Admitting: Family Medicine

## 2020-01-13 DIAGNOSIS — N2889 Other specified disorders of kidney and ureter: Secondary | ICD-10-CM | POA: Diagnosis not present

## 2020-01-13 DIAGNOSIS — R935 Abnormal findings on diagnostic imaging of other abdominal regions, including retroperitoneum: Secondary | ICD-10-CM

## 2020-01-13 MED ORDER — GADOBENATE DIMEGLUMINE 529 MG/ML IV SOLN
20.0000 mL | Freq: Once | INTRAVENOUS | Status: AC | PRN
  Administered 2020-01-13: 20 mL via INTRAVENOUS

## 2020-01-16 ENCOUNTER — Ambulatory Visit
Admission: RE | Admit: 2020-01-16 | Discharge: 2020-01-16 | Disposition: A | Discharge status: Home / Self Care | Payer: Medicare HMO | Source: Ambulatory Visit | Attending: Family Medicine | Admitting: Family Medicine

## 2020-01-16 ENCOUNTER — Other Ambulatory Visit: Payer: Self-pay

## 2020-01-16 DIAGNOSIS — R935 Abnormal findings on diagnostic imaging of other abdominal regions, including retroperitoneum: Secondary | ICD-10-CM

## 2020-03-23 ENCOUNTER — Encounter: Payer: Self-pay | Admitting: Cardiovascular Disease

## 2020-03-23 ENCOUNTER — Other Ambulatory Visit: Payer: Self-pay

## 2020-03-23 ENCOUNTER — Ambulatory Visit (INDEPENDENT_AMBULATORY_CARE_PROVIDER_SITE_OTHER): Payer: Medicare (Managed Care) | Admitting: Cardiovascular Disease

## 2020-03-23 VITALS — BP 120/70 | HR 70 | Ht 76.0 in | Wt 188.0 lb

## 2020-03-23 DIAGNOSIS — Z01812 Encounter for preprocedural laboratory examination: Secondary | ICD-10-CM

## 2020-03-23 DIAGNOSIS — I6523 Occlusion and stenosis of bilateral carotid arteries: Secondary | ICD-10-CM

## 2020-03-23 DIAGNOSIS — R0781 Pleurodynia: Secondary | ICD-10-CM | POA: Insufficient documentation

## 2020-03-23 DIAGNOSIS — E785 Hyperlipidemia, unspecified: Secondary | ICD-10-CM

## 2020-03-23 DIAGNOSIS — E7800 Pure hypercholesterolemia, unspecified: Secondary | ICD-10-CM

## 2020-03-23 DIAGNOSIS — Z5181 Encounter for therapeutic drug level monitoring: Secondary | ICD-10-CM

## 2020-03-23 HISTORY — DX: Pleurodynia: R07.81

## 2020-03-23 HISTORY — DX: Pure hypercholesterolemia, unspecified: E78.00

## 2020-03-23 MED ORDER — METOPROLOL TARTRATE 50 MG PO TABS: ORAL_TABLET | ORAL | 0 refills | Status: DC

## 2020-03-23 MED ORDER — ROSUVASTATIN CALCIUM 10 MG PO TABS: 10.0000 mg | ORAL_TABLET | Freq: Every day | ORAL | 1 refills | Status: DC

## 2020-03-23 NOTE — Patient Instructions (Addendum)
Medication Instructions:  START ROSUVASTATIN 10 MG DAILY  TAKE METOPROLOL 2 HOURS PRIOR TO CT   *If you need a refill on your cardiac medications before your next appointment, please call your pharmacy*   Lab Work: SED RATE TODAY  BMET 1 WEEK PRIOR TO CT  FASTING LP/CMET IN 3  MONTHS 1 WEEK PRIOR TO FOLLOW UP   If you have labs (blood work) drawn today and your tests are completely normal, you will receive your results only by: Marland Kitchen MyChart Message (if you have MyChart) OR . A paper copy in the mail If you have any lab test that is abnormal or we need to change your treatment, we will call you to review the results.   Testing/Procedures: Your physician has requested that you have cardiac CT. Cardiac computed tomography (CT) is a painless test that uses an x-ray machine to take clear, detailed pictures of your heart. For further information please visit [email or web address].[email or web address]. Please follow instruction sheet as given. THE OFFICE WILL CALL YOU TO SCHEDULE   Your physician has requested that you have an echocardiogram. Echocardiography is a painless test that uses sound waves to create images of your heart. It provides your doctor with information about the size and shape of your heart and how well your heart's chambers and valves are working. This procedure takes approximately one hour. There are no restrictions for this procedure. [place] [address]  Your physician has requested that you have a carotid duplex. This test is an ultrasound of the carotid arteries in your neck. It looks at blood flow through these arteries that supply the brain with blood. Allow one hour for this exam. There are no restrictions or special instructions.   Follow-Up: At [facility], you and your health needs are our priority.  As part of our continuing mission to provide you with exceptional heart care, we have created designated Provider Care Teams.  These Care Teams include your  primary Cardiologist (physician) and Advanced Practice Providers (APPs -  Physician Assistants and Nurse Practitioners) who all work together to provide you with the care you need, when you need it.  We recommend signing up for the patient portal called "MyChart".  Sign up information is provided on this After Visit Summary.  MyChart is used to connect with patients for Virtual Visits (Telemedicine).  Patients are able to view lab/test results, encounter notes, upcoming appointments, etc.  Non-urgent messages can be sent to your provider as well.   To learn more about what you can do with MyChart, go to [email or web address].    Your next appointment:   3 month(s)  The format for your next appointment:   In Person  Provider:   You may see Skeet Latch, MD or one of the following Advanced Practice Providers on your designated Care Team:    Kerin Ransom, PA-C  [place], [place]  Coletta Memos, [place]  Other Instructions  Your cardiac CT will be scheduled at one of the below locations:   [facility] [address] [place], [place] [address] [phone]  [place] [address] [place], [place] [address] [phone]  If scheduled at [facility], please arrive at the [facility] main entrance of [facility] 30 minutes prior to test start time. Proceed to the [facility] Radiology Department (first floor) to check-in and test prep.  If scheduled at [facility], please arrive  15 mins early for check-in and test prep.  Please follow these instructions carefully (unless otherwise directed):  Hold all erectile dysfunction medications at least 3 days (72 hrs) prior to test.  On the Night Before the Test: . Be sure to Drink plenty of water. . Do not consume any caffeinated/decaffeinated beverages or chocolate 12 hours prior to your test. . Do not take any  antihistamines 12 hours prior to your test. . If the patient has contrast allergy: ? Patient will need a prescription for Prednisone and very clear instructions (as follows): 1. Prednisone 50 mg - take 13 hours prior to test 2. Take another Prednisone 50 mg 7 hours prior to test 3. Take another Prednisone 50 mg 1 hour prior to test 4. Take Benadryl 50 mg 1 hour prior to test . Patient must complete all four doses of above prophylactic medications. . Patient will need a ride after test due to Benadryl.  On the Day of the Test: . Drink plenty of water. Do not drink any water within one hour of the test. . Do not eat any food 4 hours prior to the test. . You may take your regular medications prior to the test.  . Take metoprolol (Lopressor) two hours prior to test. . HOLD Furosemide/Hydrochlorothiazide morning of the test. . FEMALES- please wear underwire-free bra if available      After the Test: . Drink plenty of water. . After receiving IV contrast, you may experience a mild flushed feeling. This is normal. . On occasion, you may experience a mild rash up to 24 hours after the test. This is not dangerous. If this occurs, you can take Benadryl 25 mg and increase your fluid intake. . If you experience trouble breathing, this can be serious. If it is severe call 911 IMMEDIATELY. If it is mild, please call our office. . If you take any of these medications: Glipizide/Metformin, Avandament, Glucavance, please do not take 48 hours after completing test unless otherwise instructed.   Once we have confirmed authorization from your insurance company, we will call you to set up a date and time for your test. Based on how quickly your insurance processes prior authorizations requests, please allow up to 4 weeks to be contacted for scheduling your Cardiac CT appointment. Be advised that routine Cardiac CT appointments could be scheduled as many as 8 weeks after your provider has ordered it.  For  non-scheduling related questions, please contact the cardiac imaging nurse navigator should you have any questions/concerns: Marchia Bond, Cardiac Imaging Nurse Navigator Burley Saver, Interim Cardiac Imaging Nurse [place] [facility] Direct Office Dial: [phone]   For scheduling needs, including cancellations and rescheduling, please call Vivien Rota at [phone], option 3.

## 2020-03-23 NOTE — Progress Notes (Signed)
Cardiology Office Note   Date:  03/23/2020   ID:  Jams, Trickett 01-21-1946, MRN [id number]  PCP:  Lawerance Cruel, MD  Cardiologist:   Skeet Latch, MD   No chief complaint on file.    History of Present Illness: Christopher Arnold is a 74 y.o. male from [place] with mild carotid stenosis, DM type II (A1c 7.5 03/2014) and palpitations who presents for follow up.  He was initially seen 04/2015 for palpitations.  He wore a 21 day event monitor that showed occasional PACs and idioventricular rhythm at a rate of 80 bpm.  However he denied palpitations while wearing the monitor.   Christopher Arnold saw his dentist and had a CT scan that showed significant atherosclerosis of the internal carotids.  He was referred for carotid Dopplers 05/2017 that showed mild bilateral carotid stenosis.  He was started on atorvastatin.  He also had a screening abdominal ultrasound and ABIs that were normal.   Christopher Arnold was admitted to the hospital 03/2014 with chest pain and a code STEMI was activated for inferior Q waves and minimal ST elevation.  He underwent cardiac catheterization and was found to have no CAD.  Ultimately, he was diagnosed with cholecystitis and underwent cholecystectomy.  He previously smoked between 0.5 and 2 ppd for 35 years.  Atorvastatin was increased to 67m to reach an LDL goal of <70.  He stopped taking this but isn't sure why.  He wore a 30 day event monitor 08/2018 that showed occasional PACs and PVCs but no arrhythmias.  He has occasional fluttering but isn't bothered by it.  For the last 15 days has had constant left-sided chest pain that is slightly worse at times but always present.  It is worse when he presses on his chest and he feels a sensation deep inside.  He is also been more short of breath.  He reports that the chest pain is pleuritic but not positional.  He denies any recent illnesses.  It does not seem to be exertional.  He has not had any lower extremity edema,  orthopnea, or PND.  He does not get any formal exercise.  He does some housework and this does not seem to exacerbate it.   Past Medical History:  Diagnosis Date  . Diabetes mellitus without complication ([place]   . Osteoporosis   . Palpitations   . Pleuritic chest pain 03/23/2020  . Pure hypercholesterolemia 03/23/2020    Past Surgical History:  Procedure Laterality Date  . APPENDECTOMY  remote  . CHOLECYSTECTOMY N/A 03/25/2014   Procedure: LAPAROSCOPIC CHOLECYSTECTOMY;  Surgeon: DHarl Bowie MD;  [place]  Service: General;  Laterality: N/A;  . ESOPHAGOGASTRODUODENOSCOPY N/A 03/23/2014   Procedure: ESOPHAGOGASTRODUODENOSCOPY (EGD);  Surgeon: JJuanita Craver MD;  [facility];  Service: Endoscopy;  Laterality: N/A;  . LEFT HEART CATH N/A 03/22/2014   Procedure: LEFT HEART CATH;  Surgeon: MBlane Ohara MD;  [facility];  Service: Cardiovascular;  Laterality: N/A;     Current Outpatient Medications  Medication Sig Dispense Refill  . glimepiride (AMARYL) 1 MG tablet Take 1 mg by mouth daily.    .Marland KitchenJARDIANCE 25 MG TABS tablet Take 25 mg by mouth daily.  3  . Linagliptin-Metformin HCl (JENTADUETO) 2.01-999 MG TABS Take 2 tablets by mouth daily.     . metoprolol tartrate (LOPRESSOR) 50 MG tablet TAKE 1 TABLET 2 HOURS PRIOR TO CT 1 tablet 0  . rosuvastatin (CRESTOR) 10 MG tablet Take  1 tablet (10 mg total) by mouth daily. 90 tablet 1   No current facility-administered medications for this visit.    Allergies:   Patient has no known allergies.    Social History:  The patient  reports that he quit smoking about 14 years ago. He has never used smokeless tobacco. He reports current alcohol use. He reports that he does not use drugs.   Family History:  The patient's  family history is not on file.    ROS:  Please see the history of present illness.   Otherwise, review of systems are positive for none.   All other systems are reviewed and negative.     PHYSICAL EXAM: VS:  BP 120/70   Pulse 70   Ht '6\' 4"'$  (1.93 m)   Wt 188 lb (85.3 kg)   SpO2 95%   BMI 22.88 kg/m  , BMI Body mass index is 22.88 kg/m. GENERAL:  Well appearing HEENT: Pupils equal round and reactive, fundi not visualized, oral mucosa unremarkable NECK:  No jugular venous distention, waveform within normal limits, carotid upstroke brisk and symmetric, no bruits LUNGS:  Clear to auscultation bilaterally HEART:  RRR.  PMI not displaced or sustained,S1 and S2 within normal limits, no S3, no S4, no clicks, no rubs, no murmurs ABD:  Flat, positive bowel sounds normal in frequency in pitch, no bruits, no rebound, no guarding, no midline pulsatile mass, no hepatomegaly, no splenomegaly EXT:  2 plus pulses throughout, no edema, no cyanosis no clubbing SKIN:  No rashes no nodules NEURO:  Cranial nerves II through XII grossly intact, motor grossly intact throughout PSYCH:  Cognitively intact, oriented to person place and time   EKG:  EKG is ordered today. The ekg ordered 04/21/15 demonstrates sinus rhythm at 65 bpm.  Absent R wave progression.  One PAC. 04/26/17: Sinus rhythm.  Rate 64 bpm 10/18/17: Sinus rhythm.  Rate 61 bpm. 07/03/18: Sinus rhythm.  PACs.  Poor R wave progression 03/23/20: Sinus rhythm.  Rate 70 bpm.  Cannot rule out prior inferior MI.  R axis deviation  21 Day Event Monitor 04/21/15: Highest heart rate: 142 bpm Lowest heart rate 51 bpm Average heart rate 72 bpm  Rhythms: sinus rhythm, sinus tachycardia Accelerated idioventricular rhythm, rate ~80 bpm Occasional PACs  30 Day Event Monitor 08/23/18:  Quality: Fair.  Baseline artifact. Predominant rhythm: sinus Average heart rate: 67 bpm Max heart rate: 113 bpm Min heart rate: 48 bpm  Rare PACs and PVCs  Recent Labs: No results found for requested labs within last 8760 hours.   04/20/17:  Total cholesterol 160, triglycerides 107, HDL 38, LDL 100 Sodium 142, potassium 4.3, BUN 16, creatinine  0.78 AST 24, ALT 39  Lipid Panel    Component Value Date/Time   CHOL 165 07/04/2018 1021   TRIG 89 07/04/2018 1021   HDL 48 07/04/2018 1021   CHOLHDL 3.4 07/04/2018 1021   CHOLHDL 3.9 08/06/2008 0320   VLDL 14 08/06/2008 0320   LDLCALC 99 07/04/2018 1021      Wt Readings from Last 3 Encounters:  03/23/20 188 lb (85.3 kg)  12/24/19 184 lb (83.5 kg)  11/15/18 199 lb 6.4 oz (90.4 kg)      Other studies Reviewed: Additional studies/ records that were reviewed today include: PCP records. Review of the above records demonstrates:  Please see elsewhere in the note.     ASSESSMENT AND PLAN:  # Carotid stenosis, mild:  # Hyperlipidemia: 1-39% stenosis bilaterally 06/2018.  Continue aspirin.  Repeat carotid Dopplers.  He stopped atorvastatin for unclear reasons.  His most recent LDL was 125 and the goal is less than 70.  He is willing to try rosuvastatin 10 mg daily.  Repeat lipids and a CMP in 3 months.   # Chest pain:   Symptoms are quite atypical.  It does seem to be a pleuritic component, yet there is also pain on palpation of his chest.  We will check an ESR as he does have some very mildly elevated ST segments.  He does not seem to be diffuse consistent with pericarditis.  We will get an echocardiogram to ensure there is no evidence of pericardial effusion.  Finally, given his language barrier and refusal to use an interpreter, I am not entirely sure that I have a good understanding of his symptoms.  We will get a coronary CT-a to ensure there is no evidence of obstructive coronary disease contributing to his symptoms.  # PACs/PVCs: Stable  # AAA screening: Negative 05/2017.    Current medicines are reviewed at length with the patient today.  The patient does not have concerns regarding medicines.  The following changes have been made:  none  Labs/ tests ordered today include:  Orders Placed This Encounter  Procedures  . CT CORONARY MORPH W/CTA COR W/SCORE W/CA W/CM &/OR  WO/CM  . CT CORONARY FRACTIONAL FLOW RESERVE DATA PREP  . CT CORONARY FRACTIONAL FLOW RESERVE FLUID ANALYSIS  . Sed Rate (ESR)  . Lipid panel  . Comprehensive metabolic panel  . Basic metabolic panel  . EKG 12-Lead  . ECHOCARDIOGRAM COMPLETE  . VAS US CAROTID   Time spent: 30 minutes-Greater than 50% of this time was spent in counseling, explanation of diagnosis, planning of further management, and coordination of care.    Disposition:   FU with Dr. Jonelle Sidle C.  in 3 months.   Signed, Skeet Latch, MD  03/23/2020 1:32 PM    [place]

## 2020-03-24 LAB — SEDIMENTATION RATE: Sed Rate: 2 mm/hr (ref 0–30)

## 2020-04-02 ENCOUNTER — Other Ambulatory Visit: Payer: Self-pay

## 2020-04-02 ENCOUNTER — Encounter (HOSPITAL_COMMUNITY): Payer: Self-pay | Admitting: Cardiology

## 2020-04-02 ENCOUNTER — Ambulatory Visit (HOSPITAL_COMMUNITY): Payer: Medicare (Managed Care) | Attending: Cardiology

## 2020-04-02 DIAGNOSIS — I361 Nonrheumatic tricuspid (valve) insufficiency: Secondary | ICD-10-CM | POA: Diagnosis not present

## 2020-04-02 DIAGNOSIS — E785 Hyperlipidemia, unspecified: Secondary | ICD-10-CM | POA: Insufficient documentation

## 2020-04-02 DIAGNOSIS — E119 Type 2 diabetes mellitus without complications: Secondary | ICD-10-CM | POA: Diagnosis not present

## 2020-04-02 DIAGNOSIS — R0781 Pleurodynia: Secondary | ICD-10-CM | POA: Diagnosis not present

## 2020-04-02 DIAGNOSIS — I2510 Atherosclerotic heart disease of native coronary artery without angina pectoris: Secondary | ICD-10-CM | POA: Diagnosis not present

## 2020-04-02 DIAGNOSIS — R002 Palpitations: Secondary | ICD-10-CM | POA: Diagnosis not present

## 2020-04-02 LAB — ECHOCARDIOGRAM COMPLETE
Area-P 1/2: 3.27 cm2
S' Lateral: 2.3 cm

## 2020-04-02 NOTE — Progress Notes (Unsigned)
Patient ID: Christopher Arnold, male   DOB: May 05, 1946, 74 y.o.   MRN: [id number]   Patient is having active chest discomfort 4/10. Dr.Smith (DOD) was notified. Per Dr. Tamala Julian, patient chest discomfort was similar to Dr. Blenda Mounts office note on 03/23/20. Patient is stable to be discharged.

## 2020-04-07 ENCOUNTER — Other Ambulatory Visit: Payer: Self-pay

## 2020-04-07 ENCOUNTER — Encounter (HOSPITAL_BASED_OUTPATIENT_CLINIC_OR_DEPARTMENT_OTHER): Payer: Self-pay | Admitting: *Deleted

## 2020-04-07 ENCOUNTER — Emergency Department (HOSPITAL_BASED_OUTPATIENT_CLINIC_OR_DEPARTMENT_OTHER)
Admission: EM | Admit: 2020-04-07 | Discharge: 2020-04-08 | Disposition: A | Discharge status: Home / Self Care | Payer: Medicare (Managed Care) | Source: Home / Self Care | Attending: Emergency Medicine | Admitting: Emergency Medicine

## 2020-04-07 ENCOUNTER — Emergency Department (HOSPITAL_BASED_OUTPATIENT_CLINIC_OR_DEPARTMENT_OTHER): Payer: Medicare (Managed Care)

## 2020-04-07 DIAGNOSIS — J1282 Pneumonia due to coronavirus disease 2019: Secondary | ICD-10-CM | POA: Insufficient documentation

## 2020-04-07 DIAGNOSIS — Z87891 Personal history of nicotine dependence: Secondary | ICD-10-CM | POA: Insufficient documentation

## 2020-04-07 DIAGNOSIS — I2510 Atherosclerotic heart disease of native coronary artery without angina pectoris: Secondary | ICD-10-CM | POA: Insufficient documentation

## 2020-04-07 DIAGNOSIS — E1140 Type 2 diabetes mellitus with diabetic neuropathy, unspecified: Secondary | ICD-10-CM | POA: Insufficient documentation

## 2020-04-07 DIAGNOSIS — Z7984 Long term (current) use of oral hypoglycemic drugs: Secondary | ICD-10-CM | POA: Insufficient documentation

## 2020-04-07 DIAGNOSIS — U071 COVID-19: Secondary | ICD-10-CM | POA: Insufficient documentation

## 2020-04-07 LAB — COMPREHENSIVE METABOLIC PANEL
ALT: 39 U/L (ref 0–44)
AST: 35 U/L (ref 15–41)
Albumin: 3.6 g/dL (ref 3.5–5.0)
Alkaline Phosphatase: 69 U/L (ref 38–126)
Anion gap: 12 (ref 5–15)
BUN: 19 mg/dL (ref 8–23)
CO2: 26 mmol/L (ref 22–32)
Calcium: 8.4 mg/dL — ABNORMAL LOW (ref 8.9–10.3)
Chloride: 96 mmol/L — ABNORMAL LOW (ref 98–111)
Creatinine, Ser: 0.88 mg/dL (ref 0.61–1.24)
GFR calc Af Amer: >60 mL/min (ref >60)
GFR calc non Af Amer: >60 mL/min (ref >60)
Glucose, Bld: 124 mg/dL — ABNORMAL HIGH (ref 70–99)
Potassium: 4.2 mmol/L (ref 3.5–5.1)
Sodium: 134 mmol/L — ABNORMAL LOW (ref 135–145)
Total Bilirubin: 0.6 mg/dL (ref 0.3–1.2)
Total Protein: 6.9 g/dL (ref 6.5–8.1)

## 2020-04-07 LAB — SARS CORONAVIRUS 2 BY RT PCR (HOSPITAL ORDER, PERFORMED IN ~~LOC~~ HOSPITAL LAB): SARS Coronavirus 2: POSITIVE — AB

## 2020-04-07 LAB — CBC WITH DIFFERENTIAL/PLATELET
Abs Immature Granulocytes: 0.02 10*3/uL (ref 0.00–0.07)
Basophils Absolute: 0 10*3/uL (ref 0.0–0.1)
Basophils Relative: 0 %
Eosinophils Absolute: 0 10*3/uL (ref 0.0–0.5)
Eosinophils Relative: 0 %
HCT: 46.1 % (ref 39.0–52.0)
Hemoglobin: 15.5 g/dL (ref 13.0–17.0)
Immature Granulocytes: 1 %
Lymphocytes Relative: 27 %
Lymphs Abs: 0.7 10*3/uL (ref 0.7–4.0)
MCH: 30 pg (ref 26.0–34.0)
MCHC: 33.6 g/dL (ref 30.0–36.0)
MCV: 89.2 fL (ref 80.0–100.0)
Monocytes Absolute: 0.3 10*3/uL (ref 0.1–1.0)
Monocytes Relative: 11 %
Neutro Abs: 1.5 10*3/uL — ABNORMAL LOW (ref 1.7–7.7)
Neutrophils Relative %: 61 %
Platelets: 139 10*3/uL — ABNORMAL LOW (ref 150–400)
RBC: 5.17 MIL/uL (ref 4.22–5.81)
RDW: 12.3 % (ref 11.5–15.5)
WBC: 2.5 10*3/uL — ABNORMAL LOW (ref 4.0–10.5)
nRBC: 0 % (ref 0.0–0.2)

## 2020-04-07 LAB — LACTIC ACID, PLASMA: Lactic Acid, Venous: 1.3 mmol/L (ref 0.5–1.9)

## 2020-04-07 MED ORDER — ACETAMINOPHEN 325 MG PO TABS
650.0000 mg | ORAL_TABLET | Freq: Once | ORAL | Status: AC
  Administered 2020-04-07: 650 mg via ORAL
  Filled 2020-04-07: qty 2

## 2020-04-07 MED ORDER — SODIUM CHLORIDE 0.9 % IV BOLUS
1000.0000 mL | Freq: Once | INTRAVENOUS | Status: AC
  Administered 2020-04-07: 1000 mL via INTRAVENOUS

## 2020-04-07 MED ORDER — DEXAMETHASONE SODIUM PHOSPHATE 10 MG/ML IJ SOLN
10.0000 mg | Freq: Once | INTRAMUSCULAR | Status: AC
  Administered 2020-04-07: 10 mg via INTRAVENOUS
  Filled 2020-04-07: qty 1

## 2020-04-07 NOTE — ED Triage Notes (Signed)
Covid x 8 day. Here with sob.

## 2020-04-07 NOTE — ED Notes (Signed)
Since arrival to ED, pts Oxygen Saturation levels have slowly declined from 95% on RA to now at 90%, EDP aware, will place pt on nasal cannula @ 2lpm. Pt removed cannula immediately and stated he did not need oxygen and was ready to go home, explained the need for oxygen due to his Covid status and agrees to stay for care and wear the oxygen as indicated

## 2020-04-07 NOTE — ED Provider Notes (Signed)
[place] EMERGENCY DEPARTMENT Provider Note   CSN: [id number] Arrival date & time: 04/07/20  1728     History No chief complaint on file.   Christopher Arnold is a 74 y.o. male.  Patient is a 74 year old male with history of diabetes, hyperlipidemia, osteoporosis.  He presents today for evaluation of fever, cough, and shortness of breath.  This has been present for the past 8 days and is worsening.  Patient denies any ill contacts or exposures.  He has not received a Covid vaccination.  The history is provided by the patient.       Past Medical History:  Diagnosis Date  . Diabetes mellitus without complication ([place])   . Osteoporosis   . Palpitations   . Pleuritic chest pain 03/23/2020  . Pure hypercholesterolemia 03/23/2020    Patient Active Problem List   Diagnosis Date Noted  . Pure hypercholesterolemia 03/23/2020  . Pleuritic chest pain 03/23/2020  . Carotid artery disease ([place]) 12/24/2019  . Dyslipidemia 12/24/2019  . Palpitations   . 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 Cholecystitis, acute 03/24/2014  . Fever, unspecified 03/23/2014  . Leukocytosis, unspecified 03/23/2014  . History of chest pain 03/22/2014  . Non-insulin treated type 2 diabetes mellitus ([place]) 03/22/2014    Past Surgical History:  Procedure Laterality Date  . APPENDECTOMY  remote  . CHOLECYSTECTOMY N/A 03/25/2014   Procedure: LAPAROSCOPIC CHOLECYSTECTOMY;  Surgeon: Harl Bowie, MD;  Location: [facility];  Service: General;  Laterality: N/A;  . ESOPHAGOGASTRODUODENOSCOPY N/A 03/23/2014   Procedure: ESOPHAGOGASTRODUODENOSCOPY (EGD);  Surgeon: Juanita Craver, MD;  [facility] ENDOSCOPY;  Service: Endoscopy;  Laterality: N/A;  . LEFT HEART CATH N/A 03/22/2014   Procedure: LEFT HEART CATH;  Surgeon: Blane Ohara, MD;  [facility] C[facility] LAB;  Service: Cardiovascular;  Laterality: N/A;       Family History  Problem Relation Age of Onset  . Neuropathy Neg Hx      Social History   Tobacco Use  . Smoking status: Former Smoker    Quit date: 04/22/2005    Years since quitting: 14.9  . Smokeless tobacco: Never Used  Substance Use Topics  . Alcohol use: Yes    Alcohol/week: 0.0 standard drinks    Comment: occ  . Drug use: No    Home Medications Prior to Admission medications   Medication Sig Start Date End Date Taking? Authorizing Provider  glimepiride (AMARYL) 1 MG tablet Take 1 mg by mouth daily. 02/24/17   [provider]  JARDIANCE 25 MG TABS tablet Take 25 mg by mouth daily. 05/16/18   [provider]  Linagliptin-Metformin HCl (JENTADUETO) 2.01-999 MG TABS Take 2 tablets by mouth daily.     [provider]  metoprolol tartrate (LOPRESSOR) 50 MG tablet TAKE 1 TABLET 2 HOURS PRIOR TO CT 03/23/20   Skeet Latch, MD  rosuvastatin (CRESTOR) 10 MG tablet Take 1 tablet (10 mg total) by mouth daily. 03/23/20 06/21/20  Skeet Latch, MD    Allergies    Patient has no known allergies.  Review of Systems   Review of Systems  All other systems reviewed and are negative.   Physical Exam Updated Vital Signs BP 120/70   Pulse 81   Temp (!) 102.6 F (39.2 C) (Oral)   Resp 22   Wt 85.3 kg   SpO2 90%   BMI 22.89 kg/m   Physical Exam Vitals and nursing note reviewed.  Constitutional:      General: He is  not in acute distress.    Appearance: He is well-developed. He is not diaphoretic.  HENT:     Head: Normocephalic and atraumatic.  Cardiovascular:     Rate and Rhythm: Normal rate and regular rhythm.     Heart sounds: No murmur heard.  No friction rub.  Pulmonary:     Effort: Pulmonary effort is normal. No respiratory distress.     Breath sounds: Normal breath sounds. No wheezing or rales.  Abdominal:     General: Bowel sounds are normal. There is no distension.     Palpations: Abdomen is soft.     Tenderness: There is no abdominal tenderness.  Musculoskeletal:        General: No swelling or  tenderness. Normal range of motion.     Cervical back: Normal range of motion and neck supple.     Right lower leg: No edema.     Left lower leg: No edema.  Skin:    General: Skin is warm and dry.  Neurological:     Mental Status: He is alert and oriented to person, place, and time.     Coordination: Coordination normal.     ED Results / Procedures / Treatments   Labs (all labs ordered are listed, but only abnormal results are displayed) Labs Reviewed  CULTURE, BLOOD (ROUTINE X 2)  CULTURE, BLOOD (ROUTINE X 2)  SARS CORONAVIRUS 2 BY RT PCR (HOSPITAL ORDER, [facility])  COMPREHENSIVE METABOLIC PANEL  CBC WITH DIFFERENTIAL/PLATELET    EKG None  Radiology No results found.  Procedures Procedures (including critical care time)  Medications Ordered in ED Medications  acetaminophen (TYLENOL) tablet 650 mg (has no administration in time range)  sodium chloride 0.9 % bolus 1,000 mL (has no administration in time range)    ED Course  I have reviewed the triage vital signs and the nursing notes.  Pertinent labs & imaging results that were available during my care of the patient were reviewed by me and considered in my medical decision making (see chart for details).    MDM Rules/Calculators/A&P  Patient presenting here with complaints of shortness of breath, fever, and cough.  Patient with oxygen saturations in the 90% range, but improved with oxygen by nasal cannula.  Laboratory studies unremarkable, but Covid test is positive.  As the patient does have an oxygen requirement, I feel as though admission is indicated.  I discussed the care with Dr. Tonie Griffith who agrees to admit.  CRITICAL CARE Performed by: Veryl Speak Total critical care time: 35 minutes Critical care time was exclusive of separately billable procedures and treating other patients. Critical care was necessary to treat or prevent imminent or life-threatening  deterioration. Critical care was time spent personally by me on the following activities: development of treatment plan with patient and/or surrogate as well as nursing, discussions with consultants, evaluation of patient's response to treatment, examination of patient, obtaining history from patient or surrogate, ordering and performing treatments and interventions, ordering and review of laboratory studies, ordering and review of radiographic studies, pulse oximetry and re-evaluation of patient's condition.   Christopher Arnold was evaluated in Emergency Department on 04/07/2020 for the symptoms described in the history of present illness. He was evaluated in the context of the global COVID-19 pandemic, which necessitated consideration that the patient might be at risk for infection with the SARS-CoV-2 virus that causes COVID-19. Institutional protocols and algorithms that pertain to the evaluation of patients at risk for COVID-19 are in  a state of rapid change based on information released by regulatory bodies including the CDC and federal and state organizations. These policies and algorithms were followed during the patient's care in the ED.   Final Clinical Impression(s) / ED Diagnoses Final diagnoses:  None    Rx / DC Orders ED Discharge Orders    None       Veryl Speak, MD 04/07/20 2232

## 2020-04-07 NOTE — ED Notes (Signed)
LATE ENTRY NOTE: upon pt arrival pt was placed on cont cardiac monitor, with cont POX and int NBP assessment, isolation was implemented to high suspect of COVID positive due to SOB and not having vaccines and febrile, EDP was in room with this RN and verbal orders rec due to system downtime. IV established, BC x 2 obtained, labs obtained and pt swabbed to evaluate covid status, pt medicated for fever of 102.9 orally

## 2020-04-07 NOTE — ED Notes (Signed)
Patients Son phoned for update Christopher Arnold [phone]), son was able to identify his father by full name and DOB and what is complaint was. Provided current information, also informed that Hospitalist is being consulted for possible admission, stated that this RN will call him back in approx 1 hour for further updates

## 2020-04-08 NOTE — ED Provider Notes (Signed)
Patient had no information for coronavirus infection.  Patient was on 2 L of oxygen.  Expressed that he wanted to go home.  He was observed without oxygen for over an hour.  Appeared to have normal room air oxygenation with normal work of breathing.  He was able to ambulate without any evidence of hypoxia.  Overall he appears well.  Patient does not want to stay even if he needed oxygen.  He has capacity to make this decision.  He understands return precautions.  Will give patient infusion clinic number as he may now be a candidate for the therapy.  Discharged in good condition.  Understands isolation.  This chart was dictated using voice recognition software.  Despite best efforts to proofread,  errors can occur which can change the documentation meaning.     Lennice Sites, DO 04/08/20 [phone]

## 2020-04-08 NOTE — ED Notes (Signed)
Attempted to call pt son with no answer to update plan of care, will try again.   Also offered pt food options, pt declined.

## 2020-04-08 NOTE — ED Notes (Signed)
Pt left in care of his son, D/C paperwork reviewed with both patient and son. Follow up understood, return for worsening symptoms understood. NAD. VSS.

## 2020-04-08 NOTE — Discharge Instructions (Addendum)
Continue home isolation. Follow-up at infusion clinic and call them for possible appointment for treatment. Follow CDC guidelines.

## 2020-04-08 NOTE — ED Notes (Signed)
Pt remained off of oxygen and ambulated in room while talking, and was able to maintain O2 levels of 94%-97%. ED MD at bedside during this time.

## 2020-04-08 NOTE — ED Notes (Signed)
When rounding on patient, pt states he does not need the hospital. Explained to patient that his oxygen had dropped, and that he had been placed on 2L [place]. After speaking with ED MD [place], pt taken off of 2L [place] to see how he tolerates. Will continue to monitor.

## 2020-04-08 NOTE — ED Notes (Signed)
Patient provided water.

## 2020-04-10 ENCOUNTER — Ambulatory Visit (HOSPITAL_COMMUNITY)
Admission: RE | Admit: 2020-04-10 | Payer: Medicare (Managed Care) | Source: Ambulatory Visit | Attending: Cardiovascular Disease | Admitting: Cardiovascular Disease

## 2020-04-10 ENCOUNTER — Other Ambulatory Visit: Payer: Self-pay

## 2020-04-10 ENCOUNTER — Emergency Department (HOSPITAL_BASED_OUTPATIENT_CLINIC_OR_DEPARTMENT_OTHER): Payer: Medicare (Managed Care)

## 2020-04-10 ENCOUNTER — Inpatient Hospital Stay (HOSPITAL_BASED_OUTPATIENT_CLINIC_OR_DEPARTMENT_OTHER)
Admission: EM | Admit: 2020-04-10 | Discharge: 2020-04-13 | DRG: 177 | Discharge status: Against Medical Advice | Payer: Medicare (Managed Care) | Attending: Internal Medicine | Admitting: Internal Medicine

## 2020-04-10 ENCOUNTER — Encounter (HOSPITAL_BASED_OUTPATIENT_CLINIC_OR_DEPARTMENT_OTHER): Payer: Self-pay | Admitting: *Deleted

## 2020-04-10 DIAGNOSIS — U071 COVID-19: Principal | ICD-10-CM | POA: Diagnosis present

## 2020-04-10 DIAGNOSIS — Z5329 Procedure and treatment not carried out because of patient's decision for other reasons: Secondary | ICD-10-CM | POA: Diagnosis not present

## 2020-04-10 DIAGNOSIS — E7800 Pure hypercholesterolemia, unspecified: Secondary | ICD-10-CM | POA: Diagnosis present

## 2020-04-10 DIAGNOSIS — M810 Age-related osteoporosis without current pathological fracture: Secondary | ICD-10-CM | POA: Diagnosis present

## 2020-04-10 DIAGNOSIS — J1282 Pneumonia due to coronavirus disease 2019: Secondary | ICD-10-CM | POA: Diagnosis present

## 2020-04-10 DIAGNOSIS — J9601 Acute respiratory failure with hypoxia: Secondary | ICD-10-CM

## 2020-04-10 DIAGNOSIS — Z87891 Personal history of nicotine dependence: Secondary | ICD-10-CM

## 2020-04-10 DIAGNOSIS — Z79899 Other long term (current) drug therapy: Secondary | ICD-10-CM

## 2020-04-10 DIAGNOSIS — E119 Type 2 diabetes mellitus without complications: Secondary | ICD-10-CM

## 2020-04-10 DIAGNOSIS — Z7984 Long term (current) use of oral hypoglycemic drugs: Secondary | ICD-10-CM

## 2020-04-10 DIAGNOSIS — I2510 Atherosclerotic heart disease of native coronary artery without angina pectoris: Secondary | ICD-10-CM | POA: Diagnosis present

## 2020-04-10 HISTORY — DX: Pneumonia due to coronavirus disease 2019: J12.82

## 2020-04-10 HISTORY — DX: COVID-19: U07.1

## 2020-04-10 LAB — CBC WITH DIFFERENTIAL/PLATELET
Abs Immature Granulocytes: 0.02 10*3/uL (ref 0.00–0.07)
Basophils Absolute: 0 10*3/uL (ref 0.0–0.1)
Basophils Relative: 0 %
Eosinophils Absolute: 0 10*3/uL (ref 0.0–0.5)
Eosinophils Relative: 0 %
HCT: 44.7 % (ref 39.0–52.0)
Hemoglobin: 14.9 g/dL (ref 13.0–17.0)
Immature Granulocytes: 1 %
Lymphocytes Relative: 26 %
Lymphs Abs: 0.8 10*3/uL (ref 0.7–4.0)
MCH: 29.7 pg (ref 26.0–34.0)
MCHC: 33.3 g/dL (ref 30.0–36.0)
MCV: 89.2 fL (ref 80.0–100.0)
Monocytes Absolute: 0.5 10*3/uL (ref 0.1–1.0)
Monocytes Relative: 16 %
Neutro Abs: 1.8 10*3/uL (ref 1.7–7.7)
Neutrophils Relative %: 57 %
Platelets: 116 10*3/uL — ABNORMAL LOW (ref 150–400)
RBC: 5.01 MIL/uL (ref 4.22–5.81)
RDW: 12.1 % (ref 11.5–15.5)
WBC Morphology: ABNORMAL
WBC: 3.1 10*3/uL — ABNORMAL LOW (ref 4.0–10.5)
nRBC: 0 % (ref 0.0–0.2)

## 2020-04-10 LAB — COMPREHENSIVE METABOLIC PANEL
ALT: 27 U/L (ref 0–44)
AST: 27 U/L (ref 15–41)
Albumin: 3.2 g/dL — ABNORMAL LOW (ref 3.5–5.0)
Alkaline Phosphatase: 55 U/L (ref 38–126)
Anion gap: 11 (ref 5–15)
BUN: 19 mg/dL (ref 8–23)
CO2: 26 mmol/L (ref 22–32)
Calcium: 7.9 mg/dL — ABNORMAL LOW (ref 8.9–10.3)
Chloride: 95 mmol/L — ABNORMAL LOW (ref 98–111)
Creatinine, Ser: 0.8 mg/dL (ref 0.61–1.24)
GFR calc Af Amer: >60 mL/min (ref >60)
GFR calc non Af Amer: >60 mL/min (ref >60)
Glucose, Bld: 235 mg/dL — ABNORMAL HIGH (ref 70–99)
Potassium: 4.1 mmol/L (ref 3.5–5.1)
Sodium: 132 mmol/L — ABNORMAL LOW (ref 135–145)
Total Bilirubin: 0.6 mg/dL (ref 0.3–1.2)
Total Protein: 6.6 g/dL (ref 6.5–8.1)

## 2020-04-10 LAB — LACTIC ACID, PLASMA: Lactic Acid, Venous: 1.2 mmol/L (ref 0.5–1.9)

## 2020-04-10 LAB — FIBRINOGEN: Fibrinogen: 514 mg/dL — ABNORMAL HIGH (ref 210–475)

## 2020-04-10 LAB — TRIGLYCERIDES: Triglycerides: 57 mg/dL (ref <150)

## 2020-04-10 LAB — LACTATE DEHYDROGENASE: LDH: 164 U/L (ref 98–192)

## 2020-04-10 LAB — D-DIMER, QUANTITATIVE: D-Dimer, Quant: 0.48 ug/mL-FEU (ref 0.00–0.50)

## 2020-04-10 MED ORDER — DEXAMETHASONE SODIUM PHOSPHATE 10 MG/ML IJ SOLN
6.0000 mg | Freq: Once | INTRAMUSCULAR | Status: AC
  Administered 2020-04-10: 6 mg via INTRAVENOUS
  Filled 2020-04-10: qty 1

## 2020-04-10 MED ORDER — SODIUM CHLORIDE 0.9 % IV SOLN
100.0000 mg | INTRAVENOUS | Status: AC
  Administered 2020-04-10 (×2): 100 mg via INTRAVENOUS
  Filled 2020-04-10: qty 20

## 2020-04-10 MED ORDER — SODIUM CHLORIDE 0.9 % IV SOLN
1000.0000 mL | INTRAVENOUS | Status: DC
  Administered 2020-04-10 – 2020-04-12 (×2): 1000 mL via INTRAVENOUS

## 2020-04-10 MED ORDER — GUAIFENESIN-DM 100-10 MG/5ML PO SYRP
10.0000 mL | ORAL_SOLUTION | ORAL | Status: DC | PRN
  Filled 2020-04-10: qty 10

## 2020-04-10 MED ORDER — ACETAMINOPHEN 500 MG PO TABS
1000.0000 mg | ORAL_TABLET | Freq: Once | ORAL | Status: AC
  Administered 2020-04-10: 1000 mg via ORAL
  Filled 2020-04-10: qty 2

## 2020-04-10 MED ORDER — SODIUM CHLORIDE 0.9 % IV SOLN
100.0000 mg | Freq: Every day | INTRAVENOUS | Status: DC
  Administered 2020-04-11 – 2020-04-13 (×3): 100 mg via INTRAVENOUS
  Filled 2020-04-10 (×4): qty 20

## 2020-04-10 MED ORDER — HYDROCOD POLST-CPM POLST ER 10-8 MG/5ML PO SUER: 5.0000 mL | Freq: Two times a day (BID) | ORAL | Status: DC | PRN

## 2020-04-10 NOTE — ED Triage Notes (Signed)
Pt c/o fever with covid positive and pneumonia

## 2020-04-10 NOTE — ED Notes (Signed)
ED Provider at bedside. 

## 2020-04-10 NOTE — ED Notes (Signed)
Pt placed on oxygen 2 L by PA due to pt's oxygen sat of 91%

## 2020-04-10 NOTE — ED Provider Notes (Signed)
[place] EMERGENCY DEPARTMENT Provider Note   CSN: [id number] Arrival date & time: 04/10/20  2119     History Chief Complaint  Patient presents with  . Follow-up    COVID +    Christopher Arnold is a 74 y.o. male with a past medical history of diabetes, CAD, dyslipidemia, who presents today for evaluation of fever in the setting of Covid.  He reports he has only had Covid for "a few" days however chart review shows that he is on about day 10.  He was previously seen in the ER, was found to be hypoxic into the low 90s, placed on oxygen and admitted however he left AGAINST MEDICAL ADVICE.    He is back today due to continued fever.  He states that he has been unable to get Tylenol and wants to know what can be done to treat the fever.  His primary language is [place].  While he had declined translator I used a [employer] geologist.  Patient and I discussed that he has been diagnosed with Covid in the past.  He is primarily concerned about the fever and wanting to know when it will get better.  He reports myalgias/arthralgias.   HPI     Past Medical History:  Diagnosis Date  . COVID-19   . Diabetes mellitus without complication ([place])   . Osteoporosis   . Palpitations   . Pleuritic chest pain 03/23/2020  . Pneumonia due to COVID-19 virus   . Pure hypercholesterolemia 03/23/2020    Patient Active Problem List   Diagnosis Date Noted  . COVID-19 virus infection 04/10/2020  . COVID-19 04/07/2020  . Pure hypercholesterolemia 03/23/2020  . Pleuritic chest pain 03/23/2020  . Carotid artery disease ([place]) 12/24/2019  . Dyslipidemia 12/24/2019  . Palpitations   . 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 Cholecystitis, acute 03/24/2014  . Fever, unspecified 03/23/2014  . Leukocytosis, unspecified 03/23/2014  . History of chest pain 03/22/2014  . Non-insulin treated type 2 diabetes mellitus ([place]) 03/22/2014    Past Surgical History:  Procedure  Laterality Date  . APPENDECTOMY  remote  . CHOLECYSTECTOMY N/A 03/25/2014   Procedure: LAPAROSCOPIC CHOLECYSTECTOMY;  Surgeon: Harl Bowie, MD;  Location: [facility];  Service: General;  Laterality: N/A;  . ESOPHAGOGASTRODUODENOSCOPY N/A 03/23/2014   Procedure: ESOPHAGOGASTRODUODENOSCOPY (EGD);  Surgeon: Juanita Craver, MD;  Location: [facility] ENDOSCOPY;  Service: Endoscopy;  Laterality: N/A;  . LEFT HEART CATH N/A 03/22/2014   Procedure: LEFT HEART CATH;  Surgeon: Blane Ohara, MD;  Location: [facility] CATH LAB;  Service: Cardiovascular;  Laterality: N/A;       Family History  Problem Relation Age of Onset  . Neuropathy Neg Hx     Social History   Tobacco Use  . Smoking status: Former Smoker    Quit date: 04/22/2005    Years since quitting: 14.9  . Smokeless tobacco: Never Used  Substance Use Topics  . Alcohol use: Yes    Alcohol/week: 0.0 standard drinks    Comment: occ  . Drug use: No    Home Medications Prior to Admission medications   Medication Sig Start Date End Date Taking? Authorizing Provider  glimepiride (AMARYL) 1 MG tablet Take 1 mg by mouth daily. 02/24/17  Yes [provider]  JARDIANCE 25 MG TABS tablet Take 25 mg by mouth daily. 05/16/18  Yes [provider]  Linagliptin-Metformin HCl (JENTADUETO) 2.01-999 MG TABS Take 2 tablets by mouth daily.    Yes [provider]  metoprolol tartrate (LOPRESSOR) 50 MG tablet TAKE 1 TABLET 2 HOURS PRIOR TO CT 03/23/20  Yes Skeet Latch, MD  rosuvastatin (CRESTOR) 10 MG tablet Take 1 tablet (10 mg total) by mouth daily. 03/23/20 06/21/20 Yes Skeet Latch, MD    Allergies    Patient has no known allergies.  Review of Systems   Review of Systems  Constitutional: Positive for activity change, appetite change, chills, fatigue and fever.  Eyes: Negative for visual disturbance.  Respiratory: Positive for cough and shortness of breath.   Gastrointestinal: Negative for abdominal pain, diarrhea, nausea and  vomiting.  Genitourinary: Negative for dysuria.  Musculoskeletal: Positive for arthralgias and myalgias.  Skin: Negative for color change and rash.  Neurological: Negative for weakness and headaches.  All other systems reviewed and are negative.   Physical Exam Updated Vital Signs BP (!) 133/75   Pulse 65   Temp 98.3 F (36.8 C) (Oral)   Resp (!) 28   Ht 5\' 8"  (1.727 m)   Wt 78.9 kg   SpO2 98%   BMI 26.46 kg/m   Physical Exam Vitals and nursing note reviewed.  Constitutional:      Appearance: He is well-developed. He is ill-appearing.  HENT:     Head: Normocephalic and atraumatic.     Mouth/Throat:     Mouth: Mucous membranes are moist.  Eyes:     Conjunctiva/sclera: Conjunctivae normal.  Cardiovascular:     Rate and Rhythm: Normal rate and regular rhythm.     Pulses: Normal pulses.     Heart sounds: Normal heart sounds. No murmur heard.   Pulmonary:     Effort: Pulmonary effort is normal. No respiratory distress.     Breath sounds: Normal breath sounds. No rhonchi.     Comments: Tachypneic into the mid 30s to low 40s Abdominal:     Palpations: Abdomen is soft.     Tenderness: There is no abdominal tenderness.  Musculoskeletal:     Cervical back: Normal range of motion and neck supple.     Right lower leg: No edema.     Left lower leg: No edema.  Skin:    General: Skin is warm and dry.  Neurological:     General: No focal deficit present.     Mental Status: He is alert and oriented to person, place, and time.     Gait: Gait normal.  Psychiatric:        Mood and Affect: Mood normal.        Behavior: Behavior normal.     ED Results / Procedures / Treatments   Labs (all labs ordered are listed, but only abnormal results are displayed) Labs Reviewed  CBC WITH DIFFERENTIAL/PLATELET - Abnormal; Notable for the following components:      Result Value   WBC 3.1 (*)    Platelets 116 (*)    All other components within normal limits  COMPREHENSIVE METABOLIC  PANEL - Abnormal; Notable for the following components:   Sodium 132 (*)    Chloride 95 (*)    Glucose, Bld 235 (*)    Calcium 7.9 (*)    Albumin 3.2 (*)    All other components within normal limits  FIBRINOGEN - Abnormal; Notable for the following components:   Fibrinogen 514 (*)    All other components within normal limits  CULTURE, BLOOD (ROUTINE X 2)  CULTURE, BLOOD (ROUTINE X 2)  LACTIC ACID, PLASMA  D-DIMER, QUANTITATIVE (NOT AT [facility])  LACTATE DEHYDROGENASE  TRIGLYCERIDES  PROCALCITONIN  FERRITIN  C-REACTIVE PROTEIN    EKG None  Radiology DG Chest Portable 1 View  Result Date: 04/10/2020 CLINICAL DATA:  COVID.  Shortness of breath.  Increased weakness. EXAM: PORTABLE CHEST 1 VIEW COMPARISON:  Radiograph 04/07/2020 FINDINGS: Heterogeneous bilateral airspace disease in the mid lower lung zone predominant distribution, greatest in the right mid lung, with slight progression over the past 3 days. Mild elevation of right hemidiaphragm. Overall low lung volumes. Mild cardiomegaly with unchanged mediastinal contours. Aortic atherosclerosis. No pneumothorax or large pleural effusion. IMPRESSION: Slight progression in bilateral airspace disease over the past 3 days consistent with COVID pneumonia. Electronically Signed   By: Keith Rake M.D.   On: 04/10/2020 20:49    Procedures Procedures (including critical care time)  Medications Ordered in ED Medications  0.9 %  sodium chloride infusion (1,000 mLs Intravenous New Bag/Given 04/10/20 2203)  guaiFENesin-dextromethorphan (ROBITUSSIN DM) 100-10 MG/5ML syrup 10 mL (has no administration in time range)  chlorpheniramine-HYDROcodone (TUSSIONEX) 10-8 MG/5ML suspension 5 mL (has no administration in time range)  remdesivir 100 mg in sodium chloride 0.9 % 100 mL IVPB ( Intravenous Stopped 04/10/20 2357)    Followed by  remdesivir 100 mg in sodium chloride 0.9 % 100 mL IVPB (has no administration in time range)  dexamethasone  (DECADRON) injection 6 mg (6 mg Intravenous Given 04/10/20 2219)  acetaminophen (TYLENOL) tablet 1,000 mg (1,000 mg Oral Given 04/10/20 2218)    ED Course  I have reviewed the triage vital signs and the nursing notes.  Pertinent labs & imaging results that were available during my care of the patient were reviewed by me and considered in my medical decision making (see chart for details).  Clinical Course as of Apr 11 16  Fri Apr 10, 2020  2324 I spoke with Hospitalist who accepts patient for admission pending an open bed.    [EH]    Clinical Course User Index [EH] Ollen Gross   MDM Rules/Calculators/A&P                          Christopher Arnold was evaluated in Emergency Department on 04/11/2020 for the symptoms described in the history of present illness. He was evaluated in the context of the global COVID-19 pandemic, which necessitated consideration that the patient might be at risk for infection with the SARS-CoV-2 virus that causes COVID-19. Institutional protocols and algorithms that pertain to the evaluation of patients at risk for COVID-19 are in a state of rapid change based on information released by regulatory bodies including the CDC and federal and state organizations. These policies and algorithms were followed during the patient's care in the ED.  Patient is a 74 year old man who presents today for evaluation of Covid.  Initially his primary concern was the fever and wanting to get rid of it.  He does speak [place] and had declined needing a Optometrist, however once I started using the [place] translator discussed my concerns about him leaving [place] the other day, him being hypoxic to 91, refusing to attempt to ambulate or any exertion due to body wide pain he agreed to admission.  Labs ordered.  Leukopenia consistent with Covid.  CMP shows hyperglycemia and mild hyponatremia however is otherwise unremarkable.  D-dimer is not elevated.  Chest x-ray  was obtained showing pneumonia bilaterally consistent with his known Covid pneumonia.  He is treated with remdesivir, Decadron, Tylenol, and IV fluids.  Using [place] speaking translator  we discussed the importance of making sure he is getting all possible treatments to try to decrease his risk of death or other serious complications from [id number].  He states his understanding.  Hospitalist is consulted.  I spoke with Dr. Cyd Silence.  Patient is placed on 2 liters of oxygen with improvement in his tachypnea and oxygen saturations.  Christopher Arnold was evaluated in Emergency Department on 04/11/2020 for the symptoms described in the history of present illness. He was evaluated in the context of the global COVID-19 pandemic, which necessitated consideration that the patient might be at risk for infection with the SARS-CoV-2 virus that causes COVID-19. Institutional protocols and algorithms that pertain to the evaluation of patients at risk for COVID-19 are in a state of rapid change based on information released by regulatory bodies including the CDC and federal and state organizations. These policies and algorithms were followed during the patient's care in the ED.  Patient will be admitted.   Final Clinical Impression(s) / ED Diagnoses Final diagnoses:  Acute hypoxemic respiratory failure due to COVID-19 [facility])    Rx / DC Orders ED Discharge Orders    None       Ollen Gross 04/11/20 Roma Schanz, MD 04/11/20 1620

## 2020-04-11 ENCOUNTER — Encounter (HOSPITAL_COMMUNITY): Payer: Self-pay | Admitting: Internal Medicine

## 2020-04-11 DIAGNOSIS — J9601 Acute respiratory failure with hypoxia: Secondary | ICD-10-CM | POA: Diagnosis present

## 2020-04-11 DIAGNOSIS — J1282 Pneumonia due to coronavirus disease 2019: Secondary | ICD-10-CM | POA: Diagnosis present

## 2020-04-11 DIAGNOSIS — Z87891 Personal history of nicotine dependence: Secondary | ICD-10-CM | POA: Diagnosis not present

## 2020-04-11 DIAGNOSIS — U071 COVID-19: Secondary | ICD-10-CM | POA: Diagnosis present

## 2020-04-11 DIAGNOSIS — Z7984 Long term (current) use of oral hypoglycemic drugs: Secondary | ICD-10-CM | POA: Diagnosis not present

## 2020-04-11 DIAGNOSIS — Z79899 Other long term (current) drug therapy: Secondary | ICD-10-CM | POA: Diagnosis not present

## 2020-04-11 DIAGNOSIS — E7800 Pure hypercholesterolemia, unspecified: Secondary | ICD-10-CM | POA: Diagnosis present

## 2020-04-11 DIAGNOSIS — Z5329 Procedure and treatment not carried out because of patient's decision for other reasons: Secondary | ICD-10-CM | POA: Diagnosis not present

## 2020-04-11 DIAGNOSIS — E119 Type 2 diabetes mellitus without complications: Secondary | ICD-10-CM | POA: Diagnosis present

## 2020-04-11 DIAGNOSIS — M810 Age-related osteoporosis without current pathological fracture: Secondary | ICD-10-CM | POA: Diagnosis present

## 2020-04-11 DIAGNOSIS — I2510 Atherosclerotic heart disease of native coronary artery without angina pectoris: Secondary | ICD-10-CM | POA: Diagnosis present

## 2020-04-11 LAB — FERRITIN: Ferritin: 372 ng/mL — ABNORMAL HIGH (ref 24–336)

## 2020-04-11 LAB — CBG MONITORING, ED
Glucose-Capillary: 229 mg/dL — ABNORMAL HIGH (ref 70–99)
Glucose-Capillary: 293 mg/dL — ABNORMAL HIGH (ref 70–99)
Glucose-Capillary: 298 mg/dL — ABNORMAL HIGH (ref 70–99)

## 2020-04-11 LAB — C-REACTIVE PROTEIN: CRP: 7.1 mg/dL — ABNORMAL HIGH (ref <1.0)

## 2020-04-11 LAB — PROCALCITONIN: Procalcitonin: <0.1 ng/mL

## 2020-04-11 MED ORDER — SODIUM CHLORIDE 0.9% FLUSH
3.0000 mL | Freq: Two times a day (BID) | INTRAVENOUS | Status: DC
  Administered 2020-04-12 – 2020-04-13 (×3): 3 mL via INTRAVENOUS

## 2020-04-11 MED ORDER — INSULIN ASPART 100 UNIT/ML ~~LOC~~ SOLN: 0.0000 [IU] | Freq: Three times a day (TID) | SUBCUTANEOUS | Status: DC

## 2020-04-11 MED ORDER — EMPAGLIFLOZIN 25 MG PO TABS
25.0000 mg | ORAL_TABLET | Freq: Every day | ORAL | Status: DC
  Administered 2020-04-12: 25 mg via ORAL
  Filled 2020-04-11: qty 1

## 2020-04-11 MED ORDER — METFORMIN HCL 500 MG PO TABS
1000.0000 mg | ORAL_TABLET | Freq: Two times a day (BID) | ORAL | Status: DC
  Filled 2020-04-11: qty 2

## 2020-04-11 MED ORDER — ENOXAPARIN SODIUM 40 MG/0.4ML ~~LOC~~ SOLN
40.0000 mg | SUBCUTANEOUS | Status: DC
  Administered 2020-04-11: 40 mg via SUBCUTANEOUS
  Filled 2020-04-11 (×2): qty 0.4

## 2020-04-11 MED ORDER — LINAGLIPTIN-METFORMIN HCL 2.5-1000 MG PO TABS: 2.0000 | ORAL_TABLET | Freq: Every day | ORAL | Status: DC

## 2020-04-11 MED ORDER — INSULIN ASPART 100 UNIT/ML ~~LOC~~ SOLN
0.0000 [IU] | Freq: Every day | SUBCUTANEOUS | Status: DC
  Administered 2020-04-11: 3 [IU] via SUBCUTANEOUS

## 2020-04-11 MED ORDER — ONDANSETRON HCL 4 MG PO TABS: 4.0000 mg | ORAL_TABLET | Freq: Four times a day (QID) | ORAL | Status: DC | PRN

## 2020-04-11 MED ORDER — ROSUVASTATIN CALCIUM 5 MG PO TABS
10.0000 mg | ORAL_TABLET | Freq: Every day | ORAL | Status: DC
  Administered 2020-04-12: 10 mg via ORAL
  Filled 2020-04-11 (×2): qty 2

## 2020-04-11 MED ORDER — LINAGLIPTIN 5 MG PO TABS
5.0000 mg | ORAL_TABLET | Freq: Every day | ORAL | Status: DC
  Filled 2020-04-11: qty 1

## 2020-04-11 MED ORDER — ACETAMINOPHEN 325 MG PO TABS
650.0000 mg | ORAL_TABLET | Freq: Four times a day (QID) | ORAL | Status: DC | PRN
  Administered 2020-04-12 (×2): 650 mg via ORAL
  Filled 2020-04-11 (×2): qty 2

## 2020-04-11 MED ORDER — ONDANSETRON HCL 4 MG/2ML IJ SOLN: 4.0000 mg | Freq: Four times a day (QID) | INTRAMUSCULAR | Status: DC | PRN

## 2020-04-11 NOTE — H&P (Addendum)
History and Physical    Christopher Arnold VEL:[id number] DOB: [birth year] DOA: 04/10/2020  PCP: Lawerance Cruel, MD Patient coming from: Home  Chief Complaint: SOB, fever  HPI: Christopher Arnold is a 74 y.o. male with medical history significant of CAD, DM2, osteoporosis who presents with COVID 19 pneumonia.  He was initially positive 4 days ago and presented to the ED for fever.  He left at that time, despite an oxygen need.  He reports today that he presented again to the ED because of the fevers and not being able to get them to go away.  EDP discussed with him treatment and risks of returning home and he agreed to stay.  He denies chest pain, nausea, vomiting.  He notes that he is very concerned that his blood sugar is > 200 and that he has had off an on fevers for which he has taken tylenol, but that is the only thing wrong with him.  He further denies SOB, cough, leg pain, leg swelling, issues with urinating.  [place] translator was used.  He does understand some [place].    ED Course: In the ED, he was found to have a mildly low Na at 132, glucose of 235, albumin of 3.2.  Ferritin of 372, CRP of 7.1, PCT of < 0.10, leukopenia, normal D dimer, elevated fibrinogen.  He was placed on [facility] with improved oxygenation.  CXR showed worsening infiltrates consistent with COVID 19 pneumonia.  He was transitioned to [place] unit.    Review of Systems: As per HPI otherwise all other systems reviewed and are negative.   Past Medical History:  Diagnosis Date  . COVID-19   . Diabetes mellitus without complication ([place])   . Osteoporosis   . Palpitations   . Pleuritic chest pain 03/23/2020  . Pneumonia due to COVID-19 virus   . Pure hypercholesterolemia 03/23/2020    Past Surgical History:  Procedure Laterality Date  . APPENDECTOMY  remote  . CHOLECYSTECTOMY N/A 03/25/2014   Procedure: LAPAROSCOPIC CHOLECYSTECTOMY;  Surgeon: Harl Bowie, MD;  Location: [place];  Service: General;  Laterality:  N/A;  . ESOPHAGOGASTRODUODENOSCOPY N/A 03/23/2014   Procedure: ESOPHAGOGASTRODUODENOSCOPY (EGD);  Surgeon: Juanita Craver, MD;  Location[facility] ENDOSCOPY;  Service: Endoscopy;  Laterality: N/A;  . LEFT HEART CATH N/A 03/22/2014   Procedure: LEFT HEART CATH;  Surgeon: Blane Ohara, MD;  Location: [facility] CATH LAB;  Service: Cardiovascular;  Laterality: N/A;    Social History  reports that he quit smoking about 14 years ago. He has never used smokeless tobacco. He reports current alcohol use. He reports that he does not use drugs.  No Known Allergies  Family History  Problem Relation Age of Onset  . Healthy Mother        Not aware of any issues  . Healthy Father        Not aware of any issues.   . Neuropathy Neg Hx     Prior to Admission medications   Medication Sig Start Date End Date Taking? Authorizing Provider  glimepiride (AMARYL) 1 MG tablet Take 1 mg by mouth daily. 02/24/17  Yes [provider]  JARDIANCE 25 MG TABS tablet Take 25 mg by mouth daily. 05/16/18  Yes [provider]  Linagliptin-Metformin HCl (JENTADUETO) 2.01-999 MG TABS Take 2 tablets by mouth daily.    Yes [provider]  metoprolol tartrate (LOPRESSOR) 50 MG tablet TAKE 1 TABLET 2 HOURS PRIOR TO CT 03/23/20  Yes Skeet Latch, MD  rosuvastatin (  CRESTOR) 10 MG tablet Take 1 tablet (10 mg total) by mouth daily. 03/23/20 06/21/20 Yes Skeet Latch, MD    Physical Exam: Vitals:   04/11/20 1800 04/11/20 1900 04/11/20 2000 04/11/20 2103  BP: (!) 120/63 (!) 141/86 (!) 137/78 (!) 147/82  Pulse: 66 72 66 68  Resp: (!) 27 21 (!) 28 20  Temp: 98.8 F (37.1 C)   98.5 F (36.9 C)  TempSrc: Oral   Oral  SpO2: 94% 94% 95% 97%  Weight:      Height:        Constitutional: NAD, calm, comfortable, sitting up in bed Eyes:  lids and conjunctivae normal ENMT: Mucous membranes are moist.  Neck: normal, supple Respiratory: Decreased breath sounds throughout, worse on the right.  He has a [place] in  place.  He has some crackles at mid lung fields. No wheezing.   Cardiovascular: Regular rate and rhythm, no murmurs / rubs / gallops. NO lower extremity edema.   Abdomen: no tenderness, no masses palpated. + BS Musculoskeletal: no clubbing / cyanosis. No contractures.  Skin: no rashes, lesions, ulcers on exposed skin.  Neurologic: Grossly intact, moving all extremities spontaneously and speaking clearly.  [place] Optometrist used.  Psychiatric: Alert, oriented to time and place.  Lacks insight into his condition at this time.    Labs on Admission: I have personally reviewed following labs and imaging studies  CBC: Recent Labs  Lab 04/07/20 1820 04/10/20 2146  WBC 2.5* 3.1*  NEUTROABS 1.5* 1.8  HGB 15.5 14.9  HCT 46.1 44.7  MCV 89.2 89.2  PLT 139* 116*    Basic Metabolic Panel: Recent Labs  Lab 04/07/20 1820 04/10/20 2146  NA 134* 132*  K 4.2 4.1  CL 96* 95*  CO2 26 26  GLUCOSE 124* 235*  BUN 19 19  CREATININE 0.88 0.80  CALCIUM 8.4* 7.9*    GFR: Estimated Creatinine Clearance: 80.8 mL/min (by C-G formula based on SCr of 0.8 mg/dL).  Liver Function Tests: Recent Labs  Lab 04/07/20 1820 04/10/20 2146  AST 35 27  ALT 39 27  ALKPHOS 69 55  BILITOT 0.6 0.6  PROT 6.9 6.6  ALBUMIN 3.6 3.2*    Urine analysis:    Component Value Date/Time   COLORURINE AMBER (A) 03/24/2014 0242   APPEARANCEUR CLEAR 03/24/2014 0242   LABSPEC 1.010 03/24/2014 0242   PHURINE 6.5 03/24/2014 0242   GLUCOSEU >1000 (A) 03/24/2014 0242   HGBUR NEGATIVE 03/24/2014 0242   BILIRUBINUR SMALL (A) 03/24/2014 0242   KETONESUR 15 (A) 03/24/2014 0242   PROTEINUR 30 (A) 03/24/2014 0242   UROBILINOGEN 4.0 (H) 03/24/2014 0242   NITRITE NEGATIVE 03/24/2014 0242   LEUKOCYTESUR NEGATIVE 03/24/2014 0242    Radiological Exams on Admission: DG Chest Portable 1 View  Result Date: 04/10/2020 CLINICAL DATA:  COVID.  Shortness of breath.  Increased weakness. EXAM: PORTABLE CHEST 1 VIEW COMPARISON:   Radiograph 04/07/2020 FINDINGS: Heterogeneous bilateral airspace disease in the mid lower lung zone predominant distribution, greatest in the right mid lung, with slight progression over the past 3 days. Mild elevation of right hemidiaphragm. Overall low lung volumes. Mild cardiomegaly with unchanged mediastinal contours. Aortic atherosclerosis. No pneumothorax or large pleural effusion. IMPRESSION: Slight progression in bilateral airspace disease over the past 3 days consistent with COVID pneumonia. Electronically Signed   By: Keith Rake M.D.   On: 04/10/2020 20:49    EKG: Independently reviewed. NSR.  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  Appears unchanged from previous earlier this month.   Assessment/Plan  Pneumonia due to COVID-19 virus Acute hypoxic respiratory failure - Started on therapy with dexamethasone and remdesivir, these will be continued - Daily inflammatory labs - CRP is 7.1 - technically meets criteria for tocilizumab but will hold off unless he gets worse over time - Continue oxygen to keep O2 saturation > 90% while awake and > 88% while asleep - Monitor on telemetry - Airborne and contact precautions - Incentive spirometer - Tylenol for fever    Non-insulin treated type 2 diabetes mellitus - Continue oral linagliptin-metformin and jardiance - CBG monitoring - SSI for elevations - Diabetic diet    Pure hypercholesterolemia CAD - Continue home statin    DVT prophylaxis: Lovenox Code Status:   Full  Disposition Plan:   Patient is from:  Home  Anticipated DC to:  Home  Anticipated DC date:  04/15/20  Anticipated DC barriers: Oxygen needs, worsening status is a possibility  Consults called:  None Admission status:  IP, telemetry   Severity of Illness: The appropriate patient status for this patient is INPATIENT. Inpatient status is judged to be reasonable and necessary in order to provide the required intensity of service to ensure the patient's safety. The patient's presenting  symptoms, physical exam findings, and initial radiographic and laboratory data in the context of their chronic comorbidities is felt to place them at high risk for further clinical deterioration. Furthermore, it is not anticipated that the patient will be medically stable for discharge from the hospital within 2 midnights of admission. The following factors support the patient status of inpatient.   " The patient's presenting symptoms include fever. " The worrisome physical exam findings include severe pneumonia, decreased breath sounds. " The initial radiographic and laboratory data are worrisome because of Pneumonia. " The chronic co-morbidities include CAD, DM2.   * I certify that at the point of admission it is my clinical judgment that the patient will require inpatient hospital care spanning beyond 2 midnights from the point of admission due to high intensity of service, high risk for further deterioration and high frequency of surveillance required.Gilles Chiquito MD [facility]  How to contact the [facility] Attending or Consulting provider [place] or covering provider during after hours [place], for this patient?   1. Check the care team in [facility] and look for a) attending/consulting [facility] provider listed and b) the [facility] team listed 2. Log into [email or web address] and use [place]'s universal password to access. If you do not have the password, please contact the hospital operator. 3. Locate the [facility] provider you are looking for under [facility] and page to a number that you can be directly reached. 4. If you still have difficulty reaching the provider, please page the [facility] (Director on Call) for the Hospitalists listed on amion for assistance.  04/11/2020, 10:38 PM

## 2020-04-11 NOTE — ED Notes (Signed)
Hospitalist paged for home meds

## 2020-04-12 DIAGNOSIS — U071 COVID-19: Principal | ICD-10-CM

## 2020-04-12 DIAGNOSIS — J9601 Acute respiratory failure with hypoxia: Secondary | ICD-10-CM

## 2020-04-12 DIAGNOSIS — E119 Type 2 diabetes mellitus without complications: Secondary | ICD-10-CM

## 2020-04-12 DIAGNOSIS — E7800 Pure hypercholesterolemia, unspecified: Secondary | ICD-10-CM

## 2020-04-12 LAB — CBC
HCT: 49 % (ref 39.0–52.0)
Hemoglobin: 16.2 g/dL (ref 13.0–17.0)
MCH: 29.5 pg (ref 26.0–34.0)
MCHC: 33.1 g/dL (ref 30.0–36.0)
MCV: 89.1 fL (ref 80.0–100.0)
Platelets: 164 10*3/uL (ref 150–400)
RBC: 5.5 MIL/uL (ref 4.22–5.81)
RDW: 11.9 % (ref 11.5–15.5)
WBC: 3.1 10*3/uL — ABNORMAL LOW (ref 4.0–10.5)
nRBC: 0 % (ref 0.0–0.2)

## 2020-04-12 LAB — COMPREHENSIVE METABOLIC PANEL
ALT: 31 U/L (ref 0–44)
AST: 27 U/L (ref 15–41)
Albumin: 3.1 g/dL — ABNORMAL LOW (ref 3.5–5.0)
Alkaline Phosphatase: 57 U/L (ref 38–126)
Anion gap: 10 (ref 5–15)
BUN: 18 mg/dL (ref 8–23)
CO2: 23 mmol/L (ref 22–32)
Calcium: 8.2 mg/dL — ABNORMAL LOW (ref 8.9–10.3)
Chloride: 103 mmol/L (ref 98–111)
Creatinine, Ser: 0.68 mg/dL (ref 0.61–1.24)
GFR calc Af Amer: >60 mL/min (ref >60)
GFR calc non Af Amer: >60 mL/min (ref >60)
Glucose, Bld: 253 mg/dL — ABNORMAL HIGH (ref 70–99)
Potassium: 4.6 mmol/L (ref 3.5–5.1)
Sodium: 136 mmol/L (ref 135–145)
Total Bilirubin: 0.7 mg/dL (ref 0.3–1.2)
Total Protein: 6.8 g/dL (ref 6.5–8.1)

## 2020-04-12 LAB — CBC WITH DIFFERENTIAL/PLATELET
Abs Immature Granulocytes: 0.02 10*3/uL (ref 0.00–0.07)
Basophils Absolute: 0 10*3/uL (ref 0.0–0.1)
Basophils Relative: 0 %
Eosinophils Absolute: 0 10*3/uL (ref 0.0–0.5)
Eosinophils Relative: 0 %
HCT: 49 % (ref 39.0–52.0)
Hemoglobin: 16.2 g/dL (ref 13.0–17.0)
Immature Granulocytes: 1 %
Lymphocytes Relative: 24 %
Lymphs Abs: 0.9 10*3/uL (ref 0.7–4.0)
MCH: 29.3 pg (ref 26.0–34.0)
MCHC: 33.1 g/dL (ref 30.0–36.0)
MCV: 88.6 fL (ref 80.0–100.0)
Monocytes Absolute: 0.6 10*3/uL (ref 0.1–1.0)
Monocytes Relative: 16 %
Neutro Abs: 2.2 10*3/uL (ref 1.7–7.7)
Neutrophils Relative %: 59 %
Platelets: 178 10*3/uL (ref 150–400)
RBC: 5.53 MIL/uL (ref 4.22–5.81)
RDW: 12.1 % (ref 11.5–15.5)
WBC: 3.8 10*3/uL — ABNORMAL LOW (ref 4.0–10.5)
nRBC: 0 % (ref 0.0–0.2)

## 2020-04-12 LAB — CREATININE, SERUM
Creatinine, Ser: 0.75 mg/dL (ref 0.61–1.24)
GFR calc Af Amer: >60 mL/min (ref >60)
GFR calc non Af Amer: >60 mL/min (ref >60)

## 2020-04-12 LAB — GLUCOSE, CAPILLARY
Glucose-Capillary: 248 mg/dL — ABNORMAL HIGH (ref 70–99)
Glucose-Capillary: 216 mg/dL — ABNORMAL HIGH (ref 70–99)
Glucose-Capillary: 184 mg/dL — ABNORMAL HIGH (ref 70–99)
Glucose-Capillary: 220 mg/dL — ABNORMAL HIGH (ref 70–99)

## 2020-04-12 LAB — CULTURE, BLOOD (ROUTINE X 2)
Culture: NO GROWTH
Culture: NO GROWTH
Special Requests: ADEQUATE
Special Requests: ADEQUATE

## 2020-04-12 LAB — FERRITIN: Ferritin: 479 ng/mL — ABNORMAL HIGH (ref 24–336)

## 2020-04-12 LAB — C-REACTIVE PROTEIN: CRP: 3.3 mg/dL — ABNORMAL HIGH (ref <1.0)

## 2020-04-12 LAB — ABO/RH: ABO/RH(D): O POS

## 2020-04-12 LAB — MAGNESIUM: Magnesium: 1.9 mg/dL (ref 1.7–2.4)

## 2020-04-12 LAB — D-DIMER, QUANTITATIVE: D-Dimer, Quant: 0.63 ug/mL-FEU — ABNORMAL HIGH (ref 0.00–0.50)

## 2020-04-12 LAB — PHOSPHORUS: Phosphorus: 1.9 mg/dL — ABNORMAL LOW (ref 2.5–4.6)

## 2020-04-12 MED ORDER — DEXAMETHASONE 6 MG PO TABS
6.0000 mg | ORAL_TABLET | Freq: Every day | ORAL | Status: DC
  Administered 2020-04-12 – 2020-04-13 (×2): 6 mg via ORAL
  Filled 2020-04-12 (×2): qty 1

## 2020-04-12 MED ORDER — EMPAGLIFLOZIN 25 MG PO TABS
25.0000 mg | ORAL_TABLET | Freq: Every day | ORAL | Status: DC
  Administered 2020-04-13: 25 mg via ORAL

## 2020-04-12 MED ORDER — SODIUM PHOSPHATES 45 MMOLE/15ML IV SOLN
10.0000 mmol | Freq: Once | INTRAVENOUS | Status: AC
  Administered 2020-04-12: 10 mmol via INTRAVENOUS
  Filled 2020-04-12: qty 3.33

## 2020-04-12 MED ORDER — GLIMEPIRIDE 1 MG PO TABS
1.0000 mg | ORAL_TABLET | Freq: Every day | ORAL | Status: DC
  Administered 2020-04-13: 1 mg via ORAL

## 2020-04-12 MED ORDER — LINAGLIPTIN-METFORMIN HCL 2.5-1000 MG PO TABS
2.0000 | ORAL_TABLET | Freq: Every day | ORAL | Status: DC
  Administered 2020-04-13: 2 via ORAL

## 2020-04-12 NOTE — Progress Notes (Signed)
PROGRESS NOTE                                                                                                                                                                                                             Patient Demographics:    Christopher Arnold, is a 74 y.o. male, DOB - 01/01/1946, TJQ:[id number]  Admit date - 04/10/2020   Admitting Physician Sid Falcon, MD  Outpatient Primary MD for the patient is Lawerance Cruel, MD  LOS - 1   Chief Complaint  Patient presents with  . Follow-up    COVID +       Brief Narrative    HPI: Christopher Arnold is a 74 y.o. male with medical history significant of CAD, DM2, osteoporosis who presents with COVID 19 pneumonia.  He was initially positive 4 days ago and presented to the ED for fever.  He left at that time, despite an oxygen need.  He reports today that he presented again to the ED because of the fevers and not being able to get them to go away.  EDP discussed with him treatment and risks of returning home and he agreed to stay.  He denies chest pain, nausea, vomiting.  He notes that he is very concerned that his blood sugar is > 200 and that he has had off an on fevers for which he has taken tylenol, but that is the only thing wrong with him.  He further denies SOB, cough, leg pain, leg swelling, issues with urinating.  Christopher Arnold translator was used.  He does understand some [place].    ED Course: In the ED, he was found to have a mildly low Na at 132, glucose of 235, albumin of 3.2.  Ferritin of 372, CRP of 7.1, PCT of < 0.10, leukopenia, normal D dimer, elevated fibrinogen.  He was placed on [facility] with improved oxygenation.  CXR showed worsening infiltrates consistent with COVID 19 pneumonia.  He was transitioned to [place] unit.        Subjective:    Christopher Arnold today has, No headache, No chest pain, No abdominal pain - No Nausea, reports he is feeling better.   Assessment  & Plan :      Active Problems:   Non-insulin treated type 2 diabetes mellitus ([address])   Pure hypercholesterolemia   Acute  hypoxemic respiratory failure due to COVID-19 [facility])   Pneumonia due to COVID-19 virus - Continue with IV remdesivir. -  continue with Decadron  -Continue to monitor inflammatory markers closely. -Patient is morning saturating well on 1 L nasal cannula, so far will hold on Actemra. -Encouraged to use incentive spirometry, get out of bed to chair, discussed with staff, will try to ambulate today.  -Patient is very argumentative this morning, when he was asked if he took his vaccine reports absolutely not, because this is toxin, I do not want to put it in my body, and when tried to explain COVID-19 pathophysiology and pneumonia, he became argumentative, and reported he does not believe there is a COVID-19, and this is very likely the [employer].    Non-insulin treated type 2 diabetes mellitus - Continue oral linagliptin-metformin and jardiance -Continue with insulin sliding scale during hospital stay, as expected to be uncontrolled in the setting of steroid use.    Pure hypercholesterolemia CAD - Continue home statin    COVID-19 Labs  Recent Labs    04/10/20 2146 04/12/20 0759  DDIMER 0.48 0.63*  FERRITIN 372* 479*  LDH 164  --   CRP 7.1* 3.3*    Lab Results  Component Value Date   SARSCOV2NAA POSITIVE (A) 04/07/2020     Code Status : Full  Family Communication  : D/W son by phone.  Disposition Plan  :  Status is: Inpatient  Remains inpatient appropriate because:IV treatments appropriate due to intensity of illness or inability to take PO   Dispo: The patient is from: Home              Anticipated d/c is to: Home              Anticipated d/c date is: 3 days              Patient currently is not medically stable to d/c.         Consults  :  none  Procedures  : none  DVT Prophylaxis  :  [place] lovenox  Lab Results  Component Value Date    PLT 178 04/12/2020    Antibiotics  :    Anti-infectives (From admission, onward)   Start     Dose/Rate Route Frequency Ordered Stop   04/11/20 1000  remdesivir 100 mg in sodium chloride 0.9 % 100 mL IVPB     Discontinue    "Followed by" Linked Group Details   100 mg 200 mL/hr over 30 Minutes Intravenous Daily 04/10/20 2239 04/15/20 0959   04/10/20 2300  remdesivir 100 mg in sodium chloride 0.9 % 100 mL IVPB       "Followed by" Linked Group Details   100 mg 200 mL/hr over 30 Minutes Intravenous Every 30 min 04/10/20 2239 04/10/20 2357        Objective:   Vitals:   04/11/20 2000 04/11/20 2103 04/11/20 2319 04/12/20 0400  BP: (!) 137/78 (!) 147/82 (!) 135/65 123/74  Pulse: 66 68 72 61  Resp: (!) 28 20 16 22   Temp:  98.5 F (36.9 C) 97.7 F (36.5 C) 97.6 F (36.4 C)  TempSrc:  Oral Oral Oral  SpO2: 95% 97% 92% 93%  Weight:   80.3 kg   Height:   5\' 7"  (1.702 m)     Wt Readings from Last 3 Encounters:  04/11/20 80.3 kg  04/07/20 85.3 kg  03/23/20 85.3 kg     Intake/Output Summary (Last 24 hours) at 04/12/2020  1210 Last data filed at 04/12/2020 0900 Gross per 24 hour  Intake 960 ml  Output 1870 ml  Net -910 ml     Physical Exam  Awake Alert, Oriented X 3, No new F.N deficits, Normal affect Symmetrical Chest wall movement, Good air movement bilaterally, CTAB RRR,No Gallops,Rubs or new Murmurs, No Parasternal Heave +ve B.Sounds, Abd Soft, No tendernes, No rebound - guarding or rigidity. No Cyanosis, Clubbing or edema, No new Rash or bruise     Data Review:    CBC Recent Labs  Lab 04/07/20 1820 04/10/20 2146 04/11/20 2313 04/12/20 0759  WBC 2.5* 3.1* 3.1* 3.8*  HGB 15.5 14.9 16.2 16.2  HCT 46.1 44.7 49.0 49.0  PLT 139* 116* 164 178  MCV 89.2 89.2 89.1 88.6  MCH 30.0 29.7 29.5 29.3  MCHC 33.6 33.3 33.1 33.1  RDW 12.3 12.1 11.9 12.1  LYMPHSABS 0.7 0.8  --  0.9  MONOABS 0.3 0.5  --  0.6  EOSABS 0.0 0.0  --  0.0  BASOSABS 0.0 0.0  --  0.0     Chemistries  Recent Labs  Lab 04/07/20 1820 04/10/20 2146 04/11/20 2313 04/12/20 0759  NA 134* 132*  --  136  K 4.2 4.1  --  4.6  CL 96* 95*  --  103  CO2 26 26  --  23  GLUCOSE 124* 235*  --  253*  BUN 19 19  --  18  CREATININE 0.88 0.80 0.75 0.68  CALCIUM 8.4* 7.9*  --  8.2*  MG  --   --   --  1.9  AST 35 27  --  27  ALT 39 27  --  31  ALKPHOS 69 55  --  57  BILITOT 0.6 0.6  --  0.7   ------------------------------------------------------------------------------------------------------------------ Recent Labs    04/10/20 2146  TRIG 57    Lab Results  Component Value Date   HGBA1C 7.5 (H) 03/23/2014   ------------------------------------------------------------------------------------------------------------------ No results for input(s): TSH, T4TOTAL, T3FREE, THYROIDAB in the last 72 hours.  Invalid input(s): FREET3 ------------------------------------------------------------------------------------------------------------------ Recent Labs    04/10/20 2146 04/12/20 0759  FERRITIN 372* 479*    Coagulation profile No results for input(s): INR, PROTIME in the last 168 hours.  Recent Labs    04/10/20 2146 04/12/20 0759  DDIMER 0.48 0.63*    Cardiac Enzymes No results for input(s): CKMB, TROPONINI, MYOGLOBIN in the last 168 hours.  Invalid input(s): CK ------------------------------------------------------------------------------------------------------------------ No results found for: BNP  Inpatient Medications  Scheduled Meds: . dexamethasone  6 mg Oral Daily  . [START ON 04/13/2020] empagliflozin  25 mg Oral Daily  . enoxaparin (LOVENOX) injection  40 mg Subcutaneous Q24H  . insulin aspart  0-5 Units Subcutaneous QHS  . insulin aspart  0-9 Units Subcutaneous TID WC  . [START ON 04/13/2020] linaGLIPtin-metFORMIN HCl  2 tablet Oral Daily  . rosuvastatin  10 mg Oral Daily  . sodium chloride flush  3 mL Intravenous Q12H   Continuous  Infusions: . remdesivir 100 mg in NS 100 mL 100 mg (04/12/20 1021)   PRN Meds:.acetaminophen, chlorpheniramine-HYDROcodone, guaiFENesin-dextromethorphan, ondansetron **OR** ondansetron (ZOFRAN) IV  Micro Results Recent Results (from the past 240 hour(s))  Culture, blood (routine x 2)     Status: None   Collection Time: 04/07/20  6:20 PM   Specimen: BLOOD LEFT ARM  Result Value Ref Range Status   Specimen Description   Final    BLOOD LEFT ARM Performed at [facility]  [address] [place], [place] [address]    Special Requests   Final    BOTTLES DRAWN AEROBIC AND ANAEROBIC Blood Culture adequate volume Performed at [facility], [address] [place], [place] [address]    Culture   Final    NO GROWTH 5 DAYS Performed at [facility], [place] [address] [place], [place] [address]    Report Status 04/12/2020 FINAL  Final  Culture, blood (routine x 2)     Status: None   Collection Time: 04/07/20  6:20 PM   Specimen: BLOOD  Result Value Ref Range Status   Specimen Description   Final    BLOOD BLOOD RIGHT ARM Performed at [facility], [address] [place], [place] [address]    Special Requests   Final    BOTTLES DRAWN AEROBIC AND ANAEROBIC Blood Culture adequate volume Performed at [facility], [address] [place], [place] [address]    Culture   Final    NO GROWTH 5 DAYS Performed at [facility], [place] [address] [place], [place] [address]    Report Status 04/12/2020 FINAL  Final  SARS Coronavirus 2 by RT PCR (hospital order, performed in [facility] hospital lab) Nasopharyngeal Nasopharyngeal Swab     Status: Abnormal   Collection Time: 04/07/20  6:52 PM   Specimen: Nasopharyngeal Swab  Result Value Ref Range Status   SARS Coronavirus 2 POSITIVE (A) NEGATIVE Final    Comment: RESULT CALLED TO, READ BACK BY AND VERIFIED WITH: MARVA SIMMS RN @1942  04/07/2020 OLSONM (NOTE) SARS-CoV-2 target nucleic acids are  DETECTED  SARS-CoV-2 RNA is generally detectable in upper respiratory specimens  during the acute phase of infection.  Positive results are indicative  of the presence of the identified virus, but do not rule out bacterial infection or co-infection with other pathogens not detected by the test.  Clinical correlation with patient history and  other diagnostic information is necessary to determine patient infection status.  The expected result is negative.  Fact Sheet for Patients:   StrictlyIdeas.no   Fact Sheet for Healthcare Providers:   BankingDealers.co.za    This test is not yet approved or cleared by the [place] FDA and  has been authorized for detection and/or diagnosis of SARS-CoV-2 by FDA under an Emergency Use Authorization (EUA).  This EUA will remain in effect (meaning thi s test can be used) for the duration of  the COVID-19 declaration under Section 564(b)(1) of the Act, 21 U.S.C. section 360-bbb-3(b)(1), unless the authorization is terminated or revoked sooner.  Performed at [facility], [address] [place], [place] [address]   Blood Culture (routine x 2)     Status: None (Preliminary result)   Collection Time: 04/10/20  9:46 PM   Specimen: BLOOD  Result Value Ref Range Status   Specimen Description   Final    BLOOD LEFT ANTECUBITAL Performed at [facility], [address] [place], [place] [address]    Special Requests   Final    BOTTLES DRAWN AEROBIC AND ANAEROBIC Blood Culture adequate volume Performed at [facility], [address] [place], [place] [address]    Culture   Final    NO GROWTH 2 DAYS Performed at [facility], [place] [address] [place], [place] [address]    Report Status PENDING  Incomplete  Blood Culture (routine x 2)     Status:  None (Preliminary result)   Collection Time: 04/10/20 10:07 PM   Specimen: BLOOD  Result Value Ref Range Status    Specimen Description   Final    BLOOD BLOOD LEFT FOREARM Performed at [facility], [address] [place], [place] [address]    Special Requests   Final    BOTTLES DRAWN AEROBIC AND ANAEROBIC Blood Culture adequate volume Performed at [facility], [address] [place], [place] [address]    Culture   Final    NO GROWTH 2 DAYS Performed at [facility], [place] [address] [place], [place] [address]    Report Status PENDING  Incomplete    Radiology Reports DG Chest Portable 1 View  Result Date: 04/10/2020 CLINICAL DATA:  COVID.  Shortness of breath.  Increased weakness. EXAM: PORTABLE CHEST 1 VIEW COMPARISON:  Radiograph 04/07/2020 FINDINGS: Heterogeneous bilateral airspace disease in the mid lower lung zone predominant distribution, greatest in the right mid lung, with slight progression over the past 3 days. Mild elevation of right hemidiaphragm. Overall low lung volumes. Mild cardiomegaly with unchanged mediastinal contours. Aortic atherosclerosis. No pneumothorax or large pleural effusion. IMPRESSION: Slight progression in bilateral airspace disease over the past 3 days consistent with COVID pneumonia. Electronically Signed   By: Keith Rake M.D.   On: 04/10/2020 20:49   DG Chest Port 1 View  Result Date: 04/07/2020 CLINICAL DATA:  74 year old male with fever cough and shortness of breath. Positive for COVID-19. EXAM: PORTABLE CHEST 1 VIEW COMPARISON:  Portable chest 03/22/2014. FINDINGS: Portable AP semi upright view at 1900 hours. Patchy peripheral opacity in the mid right lung, around the minor fissure. Subtle asymmetric left lung base interstitial opacity. Chronically low lung volumes. Mediastinal contours remain within normal limits. Visualized tracheal air column is within normal limits. No pneumothorax or pulmonary edema. No definite pleural effusion. No acute osseous abnormality identified. IMPRESSION: Patchy peripheral right lung opacity and subtle  lung base interstitial markings compatible with COVID-19 pneumonia in this setting. Electronically Signed   By: Genevie Ann M.D.   On: 04/07/2020 19:23   ECHOCARDIOGRAM COMPLETE  Result Date: 04/02/2020    ECHOCARDIOGRAM REPORT   Patient Name:   Dontrey Tena Date of Exam: 04/02/2020 Medical Rec #:  [id number]       Height:       76.0 in Accession #:    [id number]      Weight:       188.0 lb Date of Birth:  05-03-1946      BSA:          2.158 m Patient Age:    73 years        BP:           126/77 mmHg Patient Gender: M               HR:           70 bpm. Exam Location:  [place] Procedure: 2D Echo, Cardiac Doppler and Color Doppler Indications:    R07.9* Chest pain, unspecified  History:        Patient has no prior history of Echocardiogram examinations.                 CAD; Risk Factors:Dyslipidemia and Diabetes. Palpitations.  Sonographer:    Diamond Nickel RCS Referring Phys: [phone] TIFFANY [place] IMPRESSIONS  1. Left ventricular ejection fraction, by estimation, is 60 to 65%. The left ventricle has normal function. The left  ventricle has no regional wall motion abnormalities. Left ventricular diastolic parameters are consistent with Grade I diastolic dysfunction (impaired relaxation).  2. Right ventricular systolic function is normal. The right ventricular size is normal. There is normal pulmonary artery systolic pressure.  3. The mitral valve is normal in structure. Trivial mitral valve regurgitation. No evidence of mitral stenosis.  4. The aortic valve is tricuspid. Aortic valve regurgitation is not visualized. Mild to moderate aortic valve sclerosis/calcification is present, without any evidence of aortic stenosis.  5. Aortic dilatation noted. 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  6. The inferior vena cava is normal in size with greater than 50% respiratory variability, suggesting right atrial pressure of 3 mmHg. FINDINGS  Left Ventricle: Left ventricular ejection  fraction, by estimation, is 60 to 65%. The left ventricle has normal function. The left ventricle has no regional wall motion abnormalities. The left ventricular internal cavity size was normal in size. There is  no left ventricular hypertrophy. Left ventricular diastolic parameters are consistent with Grade I diastolic dysfunction (impaired relaxation). Normal left ventricular filling pressure. Right Ventricle: The right ventricular size is normal. No increase in right ventricular wall thickness. Right ventricular systolic function is normal. There is normal pulmonary artery systolic pressure. The tricuspid regurgitant velocity is 2.39 m/s, and  with an assumed right atrial pressure of 10 mmHg, the estimated right ventricular systolic pressure is 07.3 mmHg. Left Atrium: Left atrial size was normal in size. Right Atrium: Right atrial size was normal in size. Pericardium: There is no evidence of pericardial effusion. Mitral Valve: The mitral valve is normal in structure. Normal mobility of the mitral valve leaflets. Mild to moderate mitral annular calcification. Trivial mitral valve regurgitation. No evidence of mitral valve stenosis. Tricuspid Valve: The tricuspid valve is normal in structure. Tricuspid valve regurgitation is trivial. No evidence of tricuspid stenosis. Aortic Valve: The aortic valve is tricuspid. Aortic valve regurgitation is not visualized. Mild to moderate aortic valve sclerosis/calcification is present, without any evidence of aortic stenosis. Pulmonic Valve: The pulmonic valve was normal in structure. Pulmonic valve regurgitation is not visualized. No evidence of pulmonic stenosis. Aorta: Aortic dilatation noted. 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 Venous: The inferior vena cava is normal in size with greater than 50% respiratory variability, suggesting right atrial pressure of 3 mmHg. IAS/Shunts: No atrial level shunt detected by color flow Doppler.  LEFT VENTRICLE  PLAX 2D LVIDd:         4.80 cm  Diastology LVIDs:         2.30 cm  LV e' lateral:   13.20 cm/s LV PW:         0.90 cm  LV E/e' lateral: 5.8 LV IVS:        1.10 cm  LV e' medial:    7.18 cm/s LVOT diam:     2.20 cm  LV E/e' medial:  10.7 LV SV:         81 LV SV Index:   37 LVOT Area:     3.80 cm  RIGHT VENTRICLE RV S prime:     10.40 cm/s TAPSE (M-mode): 2.4 cm RVSP:           25.8 mmHg LEFT ATRIUM             Index       RIGHT ATRIUM           Index LA diam:  4.30 cm 1.99 cm/m  RA Pressure: 3.00 mmHg LA Vol (A2C):   47.0 ml 21.78 ml/m RA Area:     13.20 cm LA Vol (A4C):   39.9 ml 18.49 ml/m RA Volume:   24.70 ml  11.45 ml/m LA Biplane Vol: 45.2 ml 20.95 ml/m  AORTIC VALVE LVOT Vmax:   101.00 cm/s LVOT Vmean:  65.400 cm/s LVOT VTI:    0.212 m  AORTA Ao Root diam: 3.00 cm MITRAL VALVE               TRICUSPID VALVE MV Area (PHT): 3.27 cm    TR Peak grad:   22.8 mmHg MV Decel Time: 232 msec    TR Vmax:        239.00 cm/s MV E velocity: 77.10 cm/s  Estimated RAP:  3.00 mmHg MV A velocity: 92.10 cm/s  RVSP:           25.8 mmHg MV E/A ratio:  0.84                            SHUNTS                            Systemic VTI:  0.21 m                            Systemic Diam: 2.20 cm Fransico Him MD Electronically signed by Fransico Him MD Signature Date/Time: 04/02/2020/4:42:43 PM    Final       Phillips Climes M.D on 04/12/2020 at 12:10 PM    [facility] -  Office  [phone]

## 2020-04-12 NOTE — Progress Notes (Signed)
Patient received to the unit. Patient is alert and oriented x3. Vital signs are stable. Iv in place and running fluid. Skin assessment done with another nurse. No skin breakdown noted on examination. Given instructions about call bell and phone. Bed in low position and call bell in reach.

## 2020-04-12 NOTE — Progress Notes (Signed)
Pt was upset for not having his medication Jardiance, Jentadueto, and Glimepridie on the floor. This nurse tried to explain to the patient that we have Jardiance here, but do not have Jentadueto. But we have the two medication that makes up Jentadueto to give to the patient. Glimperide was explained not on his scheduled medication in the hospital. The attending also tried to explain to the patient but, the patient was still adamant to get his home medication here. Pt son drop off his home medication and this nurse explained to the patient the process before the hospital can dispense his home med to him, but the pt took his home med regardless. Pharmacist has been called and updated on the situation. Will be using his home medication after it is process through the pharmacy.

## 2020-04-13 LAB — COMPREHENSIVE METABOLIC PANEL
ALT: 27 U/L (ref 0–44)
AST: 25 U/L (ref 15–41)
Albumin: 2.8 g/dL — ABNORMAL LOW (ref 3.5–5.0)
Alkaline Phosphatase: 54 U/L (ref 38–126)
Anion gap: 14 (ref 5–15)
BUN: 21 mg/dL (ref 8–23)
CO2: 21 mmol/L — ABNORMAL LOW (ref 22–32)
Calcium: 8.3 mg/dL — ABNORMAL LOW (ref 8.9–10.3)
Chloride: 103 mmol/L (ref 98–111)
Creatinine, Ser: 0.74 mg/dL (ref 0.61–1.24)
GFR calc Af Amer: >60 mL/min (ref >60)
GFR calc non Af Amer: >60 mL/min (ref >60)
Glucose, Bld: 128 mg/dL — ABNORMAL HIGH (ref 70–99)
Potassium: 4 mmol/L (ref 3.5–5.1)
Sodium: 138 mmol/L (ref 135–145)
Total Bilirubin: 1.1 mg/dL (ref 0.3–1.2)
Total Protein: 6.1 g/dL — ABNORMAL LOW (ref 6.5–8.1)

## 2020-04-13 LAB — CBC WITH DIFFERENTIAL/PLATELET
Abs Immature Granulocytes: 0.02 10*3/uL (ref 0.00–0.07)
Basophils Absolute: 0 10*3/uL (ref 0.0–0.1)
Basophils Relative: 0 %
Eosinophils Absolute: 0 10*3/uL (ref 0.0–0.5)
Eosinophils Relative: 0 %
HCT: 45 % (ref 39.0–52.0)
Hemoglobin: 15 g/dL (ref 13.0–17.0)
Immature Granulocytes: 1 %
Lymphocytes Relative: 17 %
Lymphs Abs: 0.6 10*3/uL — ABNORMAL LOW (ref 0.7–4.0)
MCH: 29.4 pg (ref 26.0–34.0)
MCHC: 33.3 g/dL (ref 30.0–36.0)
MCV: 88.2 fL (ref 80.0–100.0)
Monocytes Absolute: 0.5 10*3/uL (ref 0.1–1.0)
Monocytes Relative: 14 %
Neutro Abs: 2.6 10*3/uL (ref 1.7–7.7)
Neutrophils Relative %: 68 %
Platelets: 223 10*3/uL (ref 150–400)
RBC: 5.1 MIL/uL (ref 4.22–5.81)
RDW: 12 % (ref 11.5–15.5)
WBC: 3.8 10*3/uL — ABNORMAL LOW (ref 4.0–10.5)
nRBC: 0 % (ref 0.0–0.2)

## 2020-04-13 LAB — PHOSPHORUS: Phosphorus: 3.9 mg/dL (ref 2.5–4.6)

## 2020-04-13 LAB — D-DIMER, QUANTITATIVE: D-Dimer, Quant: 0.51 ug/mL-FEU — ABNORMAL HIGH (ref 0.00–0.50)

## 2020-04-13 LAB — GLUCOSE, CAPILLARY: Glucose-Capillary: 110 mg/dL — ABNORMAL HIGH (ref 70–99)

## 2020-04-13 LAB — FERRITIN: Ferritin: 438 ng/mL — ABNORMAL HIGH (ref 24–336)

## 2020-04-13 LAB — MAGNESIUM: Magnesium: 2 mg/dL (ref 1.7–2.4)

## 2020-04-13 LAB — C-REACTIVE PROTEIN: CRP: 1.7 mg/dL — ABNORMAL HIGH (ref <1.0)

## 2020-04-13 MED ORDER — ACETAMINOPHEN 325 MG PO TABS: 650.0000 mg | ORAL_TABLET | Freq: Four times a day (QID) | ORAL | Status: DC | PRN

## 2020-04-13 MED ORDER — DEXAMETHASONE 6 MG PO TABS: 6.0000 mg | ORAL_TABLET | Freq: Every day | ORAL | 0 refills | Status: DC

## 2020-04-13 NOTE — Progress Notes (Addendum)
SATURATION QUALIFICATIONS: (This note is used to comply with regulatory documentation for home oxygen)  Patient Saturations on Room Air at Rest =93%  Patient Saturations on Room Air while Ambulating = 88%  Patient Saturations on 2 Liters of oxygen while Ambulating = 93%  Please briefly explain why patient needs home oxygen: Pt O2 ambulation on room air slips down to 88% but is able to recover. Pt will benefit with O2 tank for recovery.

## 2020-04-13 NOTE — Discharge Instructions (Signed)
Person Under Monitoring Name: Christopher Arnold  Location: [place] [address]   Infection Prevention Recommendations for Individuals Confirmed to have, or Being Evaluated for, 2019 Novel Coronavirus (COVID-19) Infection Who Receive Care at Home  Individuals who are confirmed to have, or are being evaluated for, COVID-19 should follow the prevention steps below until a healthcare provider or local or state health department says they can return to normal activities.  Stay home except to get medical care You should restrict activities outside your home, except for getting medical care. Do not go to work, school, or public areas, and do not use public transportation or taxis.  Call ahead before visiting your doctor Before your medical appointment, call the healthcare provider and tell them that you have, or are being evaluated for, COVID-19 infection. This will help the healthcare provider's office take steps to keep other people from getting infected. Ask your healthcare provider to call the local or state health department.  Monitor your symptoms Seek prompt medical attention if your illness is worsening (e.g., difficulty breathing). Before going to your medical appointment, call the healthcare provider and tell them that you have, or are being evaluated for, COVID-19 infection. Ask your healthcare provider to call the local or state health department.  Wear a facemask You should wear a facemask that covers your nose and mouth when you are in the same room with other people and when you visit a healthcare provider. People who live with or visit you should also wear a facemask while they are in the same room with you.  Separate yourself from other people in your home As much as possible, you should stay in a different room from other people in your home. Also, you should use a separate bathroom, if available.  Avoid sharing household items You should not  share dishes, drinking glasses, cups, eating utensils, towels, bedding, or other items with other people in your home. After using these items, you should wash them thoroughly with soap and water.  Cover your coughs and sneezes Cover your mouth and nose with a tissue when you cough or sneeze, or you can cough or sneeze into your sleeve. Throw used tissues in a lined trash can, and immediately wash your hands with soap and water for at least 20 seconds or use an alcohol-based hand rub.  Wash your [facility] your hands often and thoroughly with soap and water for at least 20 seconds. You can use an alcohol-based hand sanitizer if soap and water are not available and if your hands are not visibly dirty. Avoid touching your eyes, nose, and mouth with unwashed hands.   Prevention Steps for Caregivers and Household Members of Individuals Confirmed to have, or Being Evaluated for, COVID-19 Infection Being Cared for in the Home  If you live with, or provide care at home for, a person confirmed to have, or being evaluated for, COVID-19 infection please follow these guidelines to prevent infection:  Follow healthcare provider's instructions Make sure that you understand and can help the patient follow any healthcare provider instructions for all care.  Provide for the patient's basic needs You should help the patient with basic needs in the home and provide support for getting groceries, prescriptions, and other personal needs.  Monitor the patient's symptoms If they are getting sicker, call his or her medical provider and tell them that the patient has, or is being evaluated for, COVID-19 infection. This will help the healthcare provider's office take  steps to keep other people from getting infected. Ask the healthcare provider to call the local or state health department.  Limit the number of people who have contact with the patient  If possible, have only one caregiver for the  patient.  Other household members should stay in another home or place of residence. If this is not possible, they should stay  in another room, or be separated from the patient as much as possible. Use a separate bathroom, if available.  Restrict visitors who do not have an essential need to be in the home.  Keep older adults, very young children, and other sick people away from the patient Keep older adults, very young children, and those who have compromised immune systems or chronic health conditions away from the patient. This includes people with chronic heart, lung, or kidney conditions, diabetes, and cancer.  Ensure good ventilation Make sure that shared spaces in the home have good air flow, such as from an air conditioner or an opened window, weather permitting.  Wash your hands often  Wash your hands often and thoroughly with soap and water for at least 20 seconds. You can use an alcohol based hand sanitizer if soap and water are not available and if your hands are not visibly dirty.  Avoid touching your eyes, nose, and mouth with unwashed hands.  Use disposable paper towels to dry your hands. If not available, use dedicated cloth towels and replace them when they become wet.  Wear a facemask and gloves  Wear a disposable facemask at all times in the room and gloves when you touch or have contact with the patient's blood, body fluids, and/or secretions or excretions, such as sweat, saliva, sputum, nasal mucus, vomit, urine, or feces.  Ensure the mask fits over your nose and mouth tightly, and do not touch it during use.  Throw out disposable facemasks and gloves after using them. Do not reuse.  Wash your hands immediately after removing your facemask and gloves.  If your personal clothing becomes contaminated, carefully remove clothing and launder. Wash your hands after handling contaminated clothing.  Place all used disposable facemasks, gloves, and other waste in a lined  container before disposing them with other household waste.  Remove gloves and wash your hands immediately after handling these items.  Do not share dishes, glasses, or other household items with the patient  Avoid sharing household items. You should not share dishes, drinking glasses, cups, eating utensils, towels, bedding, or other items with a patient who is confirmed to have, or being evaluated for, COVID-19 infection.  After the person uses these items, you should wash them thoroughly with soap and water.  Wash laundry thoroughly  Immediately remove and wash clothes or bedding that have blood, body fluids, and/or secretions or excretions, such as sweat, saliva, sputum, nasal mucus, vomit, urine, or feces, on them.  Wear gloves when handling laundry from the patient.  Read and follow directions on labels of laundry or clothing items and detergent. In general, wash and dry with the warmest temperatures recommended on the label.  Clean all areas the individual has used often  Clean all touchable surfaces, such as counters, tabletops, doorknobs, bathroom fixtures, toilets, phones, keyboards, tablets, and bedside tables, every day. Also, clean any surfaces that may have blood, body fluids, and/or secretions or excretions on them.  Wear gloves when cleaning surfaces the patient has come in contact with.  Use a diluted bleach solution (e.g., dilute bleach with 1 part bleach  and 10 parts water) or a household disinfectant with a label that says EPA-registered for coronaviruses. To make a bleach solution at home, add 1 tablespoon of bleach to 1 quart (4 cups) of water. For a larger supply, add  cup of bleach to 1 gallon (16 cups) of water.  Read labels of cleaning products and follow recommendations provided on product labels. Labels contain instructions for safe and effective use of the cleaning product including precautions you should take when applying the product, such as wearing gloves or  eye protection and making sure you have good ventilation during use of the product.  Remove gloves and wash hands immediately after cleaning.  Monitor yourself for signs and symptoms of illness Caregivers and household members are considered close contacts, should monitor their health, and will be asked to limit movement outside of the home to the extent possible. Follow the monitoring steps for close contacts listed on the symptom monitoring form.   ? If you have additional questions, contact your local health department or call the epidemiologist on call at [phone] (available 24/7). ? This guidance is subject to change. For the most up-to-date guidance from [facility], please refer to their website: [email or web address]

## 2020-04-13 NOTE — Progress Notes (Signed)
Pt is AOx4. VS per flowsheet. Denied pain or SOB. On tele. On room air. Ambulates with supervision. Pt Ambulation O2 was 88% but recovers quickly. Pt wanted to leave AMA. AMA form signed. Pt's home medication has been given back to him. Pt left via wheelchair with all of pt's belongings and in stable condition.

## 2020-04-13 NOTE — Discharge Summary (Signed)
Christopher Arnold, is a 74 y.o. male  DOB Sep 28, 1945  MRN [id number].  Admission date:  04/10/2020  Admitting Physician  Sid Falcon, MD  Discharge Date:  04/13/2020   Primary MD  Lawerance Cruel, MD  Recommendations for primary care physician for things to follow:  -Please check CBC, CMP during next visit.   Admission Diagnosis  Acute hypoxemic respiratory failure due to COVID-19 ([place]) [U07.1, J96.01] COVID-19 virus infection [U07.1] COVID-19 [U07.1] Pneumonia due to COVID-19 virus [U07.1, J12.82]   Discharge Diagnosis  Acute hypoxemic respiratory failure due to COVID-19 ([place]) [U07.1, J96.01] COVID-19 virus infection [U07.1] COVID-19 [U07.1] Pneumonia due to COVID-19 virus [U07.1, J12.82]    Active Problems:   Non-insulin treated type 2 diabetes mellitus ([place])   Pure hypercholesterolemia   Acute hypoxemic respiratory failure due to COVID-19 [facility])      Past Medical History:  Diagnosis Date  . COVID-19   . Diabetes mellitus without complication ([place])   . Osteoporosis   . Palpitations   . Pleuritic chest pain 03/23/2020  . Pneumonia due to COVID-19 virus   . Pure hypercholesterolemia 03/23/2020    Past Surgical History:  Procedure Laterality Date  . APPENDECTOMY  remote  . CHOLECYSTECTOMY N/A 03/25/2014   Procedure: LAPAROSCOPIC CHOLECYSTECTOMY;  Surgeon: Harl Bowie, MD;  Location: [facility];  Service: General;  Laterality: N/A;  . ESOPHAGOGASTRODUODENOSCOPY N/A 03/23/2014   Procedure: ESOPHAGOGASTRODUODENOSCOPY (EGD);  Surgeon: Juanita Craver, MD;  Location: [facility] ENDOSCOPY;  Service: Endoscopy;  Laterality: N/A;  . LEFT HEART CATH N/A 03/22/2014   Procedure: LEFT HEART CATH;  Surgeon: Blane Ohara, MD;  Location: [facility] CATH LAB;  Service: Cardiovascular;  Laterality: N/A;       History of present illness and  Hospital Course:     Kindly see H&P for history of present illness and  admission details, please review complete Labs, Consult reports and Test reports for all details in brief  HPI  from the history and physical done on the day of admission 04/11/2020   HPI: Christopher Arnold is a 74 y.o. male with medical history significant of CAD, DM2, osteoporosis who presents with COVID 19 pneumonia.  He was initially positive 4 days ago and presented to the ED for fever.  He left at that time, despite an oxygen need.  He reports today that he presented again to the ED because of the fevers and not being able to get them to go away.  EDP discussed with him treatment and risks of returning home and he agreed to stay.  He denies chest pain, nausea, vomiting.  He notes that he is very concerned that his blood sugar is > 200 and that he has had off an on fevers for which he has taken tylenol, but that is the only thing wrong with him.  He further denies SOB, cough, leg pain, leg swelling, issues with urinating.  [place] translator was used.  He does understand some [place].    ED Course: In the ED, he was found  to have a mildly low Na at 132, glucose of 235, albumin of 3.2.  Ferritin of 372, CRP of 7.1, PCT of < 0.10, leukopenia, normal D dimer, elevated fibrinogen.  He was placed on [facility] with improved oxygenation.  CXR showed worsening infiltrates consistent with COVID 19 pneumonia.  He was transitioned to [place] unit.    Hospital Course    Pneumonia due to COVID-19 virus -Patient did present with dyspnea, with some oxygen requirement initially, was treated with IV remdesivir, he was treated with Decadron, hypoxia has significantly improved, patient was ambulated in the hallway with oxygen saturation with activity was 88% on room air, and was adamant about leaving the hospital today, I have discussed at length with him, staying 1 more day to finish his remdesivir treatment, but he did refuse, no hypoxia at rest, lowest O2 sat was 88% with activity which does not qualify him for home  oxygen, I have discussed with his son by phone, who tried as well to convince his dad to stay 1 more day, but he was unsuccessful, patient still insisted on leaving,, so patient will go home today as AMA, but he will be given prescription to finish Decadron as an outpatient.    Non-insulin treated type 2 diabetes mellitus - resume home meds  Pure hypercholesterolemia CAD - Continue home statin   Discharge Condition: AMA   Follow UP   Follow-up Information    Skeet Latch, MD .   Specialty: Cardiology Contact information: [address] [place]  [address] [phone]                 Discharge Instructions  and  Discharge Medications    Discharge Instructions    Discharge instructions   Complete by: As directed    Follow with Primary MD Lawerance Cruel, MD in 14 days   Get CBC, CMP,  checked  by Primary MD next visit.    Activity: As tolerated with Full fall precautions use walker/cane & assistance as needed   Disposition Home    Diet: Heart Healthy /carb modified,  with feeding assistance and aspiration precautions.  On your next visit with your primary care physician please Get Medicines reviewed and adjusted.   Please request your Prim.MD to go over all Hospital Tests and Procedure/Radiological results at the follow up, please get all Hospital records sent to your Prim MD by signing hospital release before you go home.   If you experience worsening of your admission symptoms, develop shortness of breath, life threatening emergency, suicidal or homicidal thoughts you must seek medical attention immediately by calling 911 or calling your MD immediately  if symptoms less severe.  You Must read complete instructions/literature along with all the possible adverse reactions/side effects for all the Medicines you take and that have been prescribed to you. Take any new Medicines after you have completely understood and accpet all the  possible adverse reactions/side effects.   Do not drive, operating heavy machinery, perform activities at heights, swimming or participation in water activities or provide baby sitting services if your were admitted for syncope or siezures until you have seen by Primary MD or a Neurologist and advised to do so again.  Do not drive when taking Pain medications.    Do not take more than prescribed Pain, Sleep and Anxiety Medications  Special Instructions: If you have smoked or chewed Tobacco  in the last 2 yrs please stop smoking, stop any regular Alcohol  and or any  Recreational drug use.  Wear Seat belts while driving.   Please note  You were cared for by a hospitalist during your hospital stay. If you have any questions about your discharge medications or the care you received while you were in the hospital after you are discharged, you can call the unit and asked to speak with the hospitalist on call if the hospitalist that took care of you is not available. Once you are discharged, your primary care physician will handle any further medical issues. Please note that NO REFILLS for any discharge medications will be authorized once you are discharged, as it is imperative that you return to your primary care physician (or establish a relationship with a primary care physician if you do not have one) for your aftercare needs so that they can reassess your need for medications and monitor your lab values.   Increase activity slowly   Complete by: As directed      Allergies as of 04/13/2020   No Known Allergies     Medication List    STOP taking these medications   metoprolol tartrate 50 MG tablet Commonly known as: Lopressor     TAKE these medications   acetaminophen 325 MG tablet Commonly known as: TYLENOL Take 2 tablets (650 mg total) by mouth every 6 (six) hours as needed for mild pain or headache (fever >/= 101).   dexamethasone 6 MG tablet Commonly known as: DECADRON Take 1  tablet (6 mg total) by mouth daily. Start taking on: April 14, 2020   glimepiride 1 MG tablet Commonly known as: AMARYL Take 1 mg by mouth daily.   Jardiance 25 MG Tabs tablet Generic drug: empagliflozin Take 25 mg by mouth daily.   Jentadueto 2.01-999 MG Tabs Generic drug: linaGLIPtin-metFORMIN HCl Take 2 tablets by mouth daily.   rosuvastatin 10 MG tablet Commonly known as: CRESTOR Take 1 tablet (10 mg total) by mouth daily.         Diet and Activity recommendation: See Discharge Instructions above   Consults obtained -  none   Major procedures and Radiology Reports - PLEASE review detailed and final reports for all details, in brief -      DG Chest Portable 1 View  Result Date: 04/10/2020 CLINICAL DATA:  COVID.  Shortness of breath.  Increased weakness. EXAM: PORTABLE CHEST 1 VIEW COMPARISON:  Radiograph 04/07/2020 FINDINGS: Heterogeneous bilateral airspace disease in the mid lower lung zone predominant distribution, greatest in the right mid lung, with slight progression over the past 3 days. Mild elevation of right hemidiaphragm. Overall low lung volumes. Mild cardiomegaly with unchanged mediastinal contours. Aortic atherosclerosis. No pneumothorax or large pleural effusion. IMPRESSION: Slight progression in bilateral airspace disease over the past 3 days consistent with COVID pneumonia. Electronically Signed   By: Keith Rake M.D.   On: 04/10/2020 20:49   DG Chest Port 1 View  Result Date: 04/07/2020 CLINICAL DATA:  74 year old male with fever cough and shortness of breath. Positive for COVID-19. EXAM: PORTABLE CHEST 1 VIEW COMPARISON:  Portable chest 03/22/2014. FINDINGS: Portable AP semi upright view at 1900 hours. Patchy peripheral opacity in the mid right lung, around the minor fissure. Subtle asymmetric left lung base interstitial opacity. Chronically low lung volumes. Mediastinal contours remain within normal limits. Visualized tracheal air column is within  normal limits. No pneumothorax or pulmonary edema. No definite pleural effusion. No acute osseous abnormality identified. IMPRESSION: Patchy peripheral right lung opacity and subtle lung base interstitial markings compatible with COVID-19 pneumonia  in this setting. Electronically Signed   By: Genevie Ann M.D.   On: 04/07/2020 19:23   ECHOCARDIOGRAM COMPLETE  Result Date: 04/02/2020    ECHOCARDIOGRAM REPORT   Patient Name:   Yandel Sheridan Date of Exam: 04/02/2020 Medical Rec #:  [id number]       Height:       76.0 in Accession #:    [id number]      Weight:       188.0 lb Date of Birth:  Sep 16, 1945      BSA:          2.158 m Patient Age:    68 years        BP:           126/77 mmHg Patient Gender: M               HR:           70 bpm. Exam Location:  [place] Procedure: 2D Echo, Cardiac Doppler and Color Doppler Indications:    R07.9* Chest pain, unspecified  History:        Patient has no prior history of Echocardiogram examinations.                 CAD; Risk Factors:Dyslipidemia and Diabetes. Palpitations.  Sonographer:    Diamond Nickel RCS Referring Phys: [phone] TIFFANY [place] IMPRESSIONS  1. Left ventricular ejection fraction, by estimation, is 60 to 65%. The left ventricle has normal function. The left ventricle has no regional wall motion abnormalities. Left ventricular diastolic parameters are consistent with Grade I diastolic dysfunction (impaired relaxation).  2. Right ventricular systolic function is normal. The right ventricular size is normal. There is normal pulmonary artery systolic pressure.  3. The mitral valve is normal in structure. Trivial mitral valve regurgitation. No evidence of mitral stenosis.  4. The aortic valve is tricuspid. Aortic valve regurgitation is not visualized. Mild to moderate aortic valve sclerosis/calcification is present, without any evidence of aortic stenosis.  5. Aortic dilatation noted. 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  6. The  inferior vena cava is normal in size with greater than 50% respiratory variability, suggesting right atrial pressure of 3 mmHg. FINDINGS  Left Ventricle: Left ventricular ejection fraction, by estimation, is 60 to 65%. The left ventricle has normal function. The left ventricle has no regional wall motion abnormalities. The left ventricular internal cavity size was normal in size. There is  no left ventricular hypertrophy. Left ventricular diastolic parameters are consistent with Grade I diastolic dysfunction (impaired relaxation). Normal left ventricular filling pressure. Right Ventricle: The right ventricular size is normal. No increase in right ventricular wall thickness. Right ventricular systolic function is normal. There is normal pulmonary artery systolic pressure. The tricuspid regurgitant velocity is 2.39 m/s, and  with an assumed right atrial pressure of 10 mmHg, the estimated right ventricular systolic pressure is 29.5 mmHg. Left Atrium: Left atrial size was normal in size. Right Atrium: Right atrial size was normal in size. Pericardium: There is no evidence of pericardial effusion. Mitral Valve: The mitral valve is normal in structure. Normal mobility of the mitral valve leaflets. Mild to moderate mitral annular calcification. Trivial mitral valve regurgitation. No evidence of mitral valve stenosis. Tricuspid Valve: The tricuspid valve is normal in structure. Tricuspid valve regurgitation is trivial. No evidence of tricuspid stenosis. Aortic Valve: The aortic valve is tricuspid. Aortic valve regurgitation is not visualized. Mild to moderate aortic valve sclerosis/calcification is present, without any  evidence of aortic stenosis. Pulmonic Valve: The pulmonic valve was normal in structure. Pulmonic valve regurgitation is not visualized. No evidence of pulmonic stenosis. Aorta: Aortic dilatation noted. 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 Venous: The inferior vena cava is normal  in size with greater than 50% respiratory variability, suggesting right atrial pressure of 3 mmHg. IAS/Shunts: No atrial level shunt detected by color flow Doppler.  LEFT VENTRICLE PLAX 2D LVIDd:         4.80 cm  Diastology LVIDs:         2.30 cm  LV e' lateral:   13.20 cm/s LV PW:         0.90 cm  LV E/e' lateral: 5.8 LV IVS:        1.10 cm  LV e' medial:    7.18 cm/s LVOT diam:     2.20 cm  LV E/e' medial:  10.7 LV SV:         81 LV SV Index:   37 LVOT Area:     3.80 cm  RIGHT VENTRICLE RV S prime:     10.40 cm/s TAPSE (M-mode): 2.4 cm RVSP:           25.8 mmHg LEFT ATRIUM             Index       RIGHT ATRIUM           Index LA diam:        4.30 cm 1.99 cm/m  RA Pressure: 3.00 mmHg LA Vol (A2C):   47.0 ml 21.78 ml/m RA Area:     13.20 cm LA Vol (A4C):   39.9 ml 18.49 ml/m RA Volume:   24.70 ml  11.45 ml/m LA Biplane Vol: 45.2 ml 20.95 ml/m  AORTIC VALVE LVOT Vmax:   101.00 cm/s LVOT Vmean:  65.400 cm/s LVOT VTI:    0.212 m  AORTA Ao Root diam: 3.00 cm MITRAL VALVE               TRICUSPID VALVE MV Area (PHT): 3.27 cm    TR Peak grad:   22.8 mmHg MV Decel Time: 232 msec    TR Vmax:        239.00 cm/s MV E velocity: 77.10 cm/s  Estimated RAP:  3.00 mmHg MV A velocity: 92.10 cm/s  RVSP:           25.8 mmHg MV E/A ratio:  0.84                            SHUNTS                            Systemic VTI:  0.21 m                            Systemic Diam: 2.20 cm Fransico Him MD Electronically signed by Fransico Him MD Signature Date/Time: 04/02/2020/4:42:43 PM    Final     Micro Results    Recent Results (from the past 240 hour(s))  Culture, blood (routine x 2)     Status: None   Collection Time: 04/07/20  6:20 PM   Specimen: BLOOD LEFT ARM  Result Value Ref Range Status   Specimen Description   Final    BLOOD LEFT ARM Performed at [facility], [address], [place], [place] [address]    Special Requests   Final    BOTTLES DRAWN AEROBIC AND ANAEROBIC Blood Culture adequate volume Performed  at [facility], [address] [place], [place] [address]    Culture   Final    NO GROWTH 5 DAYS Performed at [facility], [place] [address] [place], [place] [address]    Report Status 04/12/2020 FINAL  Final  Culture, blood (routine x 2)     Status: None   Collection Time: 04/07/20  6:20 PM   Specimen: BLOOD  Result Value Ref Range Status   Specimen Description   Final    BLOOD BLOOD RIGHT ARM Performed at [facility], [address] [place], [place] [address]    Special Requests   Final    BOTTLES DRAWN AEROBIC AND ANAEROBIC Blood Culture adequate volume Performed at [facility], [address] [place], [place] [address]    Culture   Final    NO GROWTH 5 DAYS Performed at [facility], [place] [address], [place], [place] [address]    Report Status 04/12/2020 FINAL  Final  SARS Coronavirus 2 by RT PCR (hospital order, performed in [facility] hospital lab) Nasopharyngeal Nasopharyngeal Swab     Status: Abnormal   Collection Time: 04/07/20  6:52 PM   Specimen: Nasopharyngeal Swab  Result Value Ref Range Status   SARS Coronavirus 2 POSITIVE (A) NEGATIVE Final    Comment: RESULT CALLED TO, READ BACK BY AND VERIFIED WITH: MARVA SIMMS RN @1942  04/07/2020 OLSONM (NOTE) SARS-CoV-2 target nucleic acids are DETECTED  SARS-CoV-2 RNA is generally detectable in upper respiratory specimens  during the acute phase of infection.  Positive results are indicative  of the presence of the identified virus, but do not rule out bacterial infection or co-infection with other pathogens not detected by the test.  Clinical correlation with patient history and  other diagnostic information is necessary to determine patient infection status.  The expected result is negative.  Fact Sheet for Patients:   StrictlyIdeas.no   Fact Sheet for Healthcare Providers:   BankingDealers.co.za    This test is not yet  approved or cleared by the [place] FDA and  has been authorized for detection and/or diagnosis of SARS-CoV-2 by FDA under an Emergency Use Authorization (EUA).  This EUA will remain in effect (meaning thi s test can be used) for the duration of  the COVID-19 declaration under Section 564(b)(1) of the Act, 21 U.S.C. section 360-bbb-3(b)(1), unless the authorization is terminated or revoked sooner.  Performed at [facility], [address] [place], [place] [address]   Blood Culture (routine x 2)     Status: None (Preliminary result)   Collection Time: 04/10/20  9:46 PM   Specimen: BLOOD  Result Value Ref Range Status   Specimen Description   Final    BLOOD LEFT ANTECUBITAL Performed at [facility], [address] [place], [place] [address]    Special Requests   Final    BOTTLES DRAWN AEROBIC AND ANAEROBIC Blood Culture adequate volume Performed at [facility], [address] [place], [place] [address]    Culture   Final    NO GROWTH 2 DAYS Performed at [facility], [place] [address] [place], [place] [address]    Report Status PENDING  Incomplete  Blood Culture (routine x 2)  Status: None (Preliminary result)   Collection Time: 04/10/20 10:07 PM   Specimen: BLOOD  Result Value Ref Range Status   Specimen Description   Final    BLOOD BLOOD LEFT FOREARM Performed at [facility], [address] [place], [place] [address]    Special Requests   Final    BOTTLES DRAWN AEROBIC AND ANAEROBIC Blood Culture adequate volume Performed at [facility], [address] [place], [place] [address]    Culture   Final    NO GROWTH 2 DAYS Performed at [facility], [place] [address] [place], [place] [address]    Report Status PENDING  Incomplete       Today   Subjective:   Jethro Ausley today has no headache,no chest abdominal pain,no new weakness tingling or numbness, feels much better adamant about  going home today.  Objective:   Blood pressure (!) 137/83, pulse 85, temperature 98.5 F (36.9 C), temperature source Oral, resp. rate 20, height 5\' 7"  (1.702 m), weight 80.3 kg, SpO2 90 %.   Intake/Output Summary (Last 24 hours) at 04/13/2020 1129 Last data filed at 04/12/2020 2331 Gross per 24 hour  Intake --  Output 1000 ml  Net -1000 ml    Exam Awake Alert, Oriented x 3, No new F.N deficits, Normal affect Symmetrical Chest wall movement, Good air movement bilaterally, CTAB RRR,No Gallops,Rubs or new Murmurs, No Parasternal Heave Abd Soft, Non tender,  No Cyanosis, Clubbing or edema  Data Review   CBC w Diff:  Lab Results  Component Value Date   WBC 3.8 (L) 04/13/2020   HGB 15.0 04/13/2020   HGB 16.3 07/04/2018   HCT 45.0 04/13/2020   HCT 47.9 07/04/2018   PLT 223 04/13/2020   PLT 191 07/04/2018   LYMPHOPCT 17 04/13/2020   MONOPCT 14 04/13/2020   EOSPCT 0 04/13/2020   BASOPCT 0 04/13/2020    CMP:  Lab Results  Component Value Date   NA 138 04/13/2020   NA 143 07/04/2018   K 4.0 04/13/2020   CL 103 04/13/2020   CO2 21 (L) 04/13/2020   BUN 21 04/13/2020   BUN 23 07/04/2018   CREATININE 0.74 04/13/2020   CREATININE 0.66 (L) 04/21/2015   PROT 6.1 (L) 04/13/2020   PROT 7.1 07/04/2018   ALBUMIN 2.8 (L) 04/13/2020   ALBUMIN 4.6 07/04/2018   BILITOT 1.1 04/13/2020   BILITOT 1.0 07/04/2018   ALKPHOS 54 04/13/2020   AST 25 04/13/2020   ALT 27 04/13/2020  .   Total Time in preparing paper work, data evaluation and todays exam - 27 minutes  Phillips Climes M.D on 04/13/2020 at 11:29 AM  [facility]   Office  [phone]

## 2020-04-15 LAB — CULTURE, BLOOD (ROUTINE X 2)
Culture: NO GROWTH
Culture: NO GROWTH
Special Requests: ADEQUATE
Special Requests: ADEQUATE

## 2020-04-27 ENCOUNTER — Emergency Department (HOSPITAL_COMMUNITY): Payer: Medicare (Managed Care)

## 2020-04-27 ENCOUNTER — Encounter (HOSPITAL_COMMUNITY): Payer: Self-pay | Admitting: Emergency Medicine

## 2020-04-27 ENCOUNTER — Emergency Department (HOSPITAL_COMMUNITY)
Admission: EM | Admit: 2020-04-27 | Discharge: 2020-04-28 | Disposition: A | Discharge status: Home / Self Care | Payer: Medicare (Managed Care) | Attending: Emergency Medicine | Admitting: Emergency Medicine

## 2020-04-27 DIAGNOSIS — Z5321 Procedure and treatment not carried out due to patient leaving prior to being seen by health care provider: Secondary | ICD-10-CM | POA: Insufficient documentation

## 2020-04-27 DIAGNOSIS — Z7984 Long term (current) use of oral hypoglycemic drugs: Secondary | ICD-10-CM | POA: Insufficient documentation

## 2020-04-27 DIAGNOSIS — R0602 Shortness of breath: Secondary | ICD-10-CM | POA: Diagnosis not present

## 2020-04-27 DIAGNOSIS — E1165 Type 2 diabetes mellitus with hyperglycemia: Secondary | ICD-10-CM | POA: Diagnosis not present

## 2020-04-27 LAB — URINALYSIS, ROUTINE W REFLEX MICROSCOPIC
Bacteria, UA: NONE SEEN
Bilirubin Urine: NEGATIVE
Glucose, UA: >=500 mg/dL — AB
Hgb urine dipstick: NEGATIVE
Ketones, ur: 5 mg/dL — AB
Leukocytes,Ua: NEGATIVE
Nitrite: NEGATIVE
Protein, ur: NEGATIVE mg/dL
Specific Gravity, Urine: 1.03 (ref 1.005–1.030)
pH: 6 (ref 5.0–8.0)

## 2020-04-27 LAB — CBC
HCT: 49.3 % (ref 39.0–52.0)
Hemoglobin: 15.9 g/dL (ref 13.0–17.0)
MCH: 30 pg (ref 26.0–34.0)
MCHC: 32.3 g/dL (ref 30.0–36.0)
MCV: 93 fL (ref 80.0–100.0)
Platelets: 217 10*3/uL (ref 150–400)
RBC: 5.3 MIL/uL (ref 4.22–5.81)
RDW: 13.1 % (ref 11.5–15.5)
WBC: 10.5 10*3/uL (ref 4.0–10.5)
nRBC: 0 % (ref 0.0–0.2)

## 2020-04-27 LAB — CBG MONITORING, ED: Glucose-Capillary: 212 mg/dL — ABNORMAL HIGH (ref 70–99)

## 2020-04-27 LAB — BASIC METABOLIC PANEL
Anion gap: 13 (ref 5–15)
BUN: 23 mg/dL (ref 8–23)
CO2: 25 mmol/L (ref 22–32)
Calcium: 9.5 mg/dL (ref 8.9–10.3)
Chloride: 99 mmol/L (ref 98–111)
Creatinine, Ser: 0.81 mg/dL (ref 0.61–1.24)
GFR calc Af Amer: >60 mL/min (ref >60)
GFR calc non Af Amer: >60 mL/min (ref >60)
Glucose, Bld: 261 mg/dL — ABNORMAL HIGH (ref 70–99)
Potassium: 5.9 mmol/L — ABNORMAL HIGH (ref 3.5–5.1)
Sodium: 137 mmol/L (ref 135–145)

## 2020-04-27 NOTE — ED Triage Notes (Signed)
Pt was Covid + July 27. Since then had trouble taking deep breaths and fatigue. Blood sugars have been high and not coming down with oral diabetic medications.

## 2020-04-28 DIAGNOSIS — R0602 Shortness of breath: Secondary | ICD-10-CM | POA: Diagnosis not present

## 2020-04-28 LAB — GLUCOSE, CAPILLARY: Glucose-Capillary: 229 mg/dL — ABNORMAL HIGH (ref 70–99)

## 2020-04-28 NOTE — ED Notes (Signed)
Pt wanting to speak to a doctor. ED staff made patient aware that a doctor isn't available to come up to speak with patients at this time. Pt upset and stated that he was leaving.

## 2020-05-12 ENCOUNTER — Ambulatory Visit (HOSPITAL_COMMUNITY)
Admission: RE | Admit: 2020-05-12 | Discharge: 2020-05-12 | Disposition: A | Discharge status: Home / Self Care | Payer: Medicare (Managed Care) | Source: Ambulatory Visit | Attending: Internal Medicine | Admitting: Internal Medicine

## 2020-05-12 ENCOUNTER — Telehealth (HOSPITAL_COMMUNITY): Payer: Self-pay | Admitting: *Deleted

## 2020-05-12 ENCOUNTER — Other Ambulatory Visit: Payer: Self-pay

## 2020-05-12 DIAGNOSIS — I6523 Occlusion and stenosis of bilateral carotid arteries: Secondary | ICD-10-CM

## 2020-05-12 NOTE — Telephone Encounter (Signed)
Reaching out to patient to offer assistance regarding upcoming cardiac imaging study; pt verbalizes understanding of appt date/time, parking situation and where to check in, pre-test NPO status and medications ordered, and verified current allergies; name and call back number provided for further questions should they arise  Mylik Pro Tai RN Navigator Cardiac Imaging [facility] [phone] office [phone] cell 

## 2020-05-13 ENCOUNTER — Ambulatory Visit (HOSPITAL_COMMUNITY)
Admission: RE | Admit: 2020-05-13 | Discharge: 2020-05-13 | Disposition: A | Discharge status: Home / Self Care | Payer: Medicare (Managed Care) | Source: Ambulatory Visit | Attending: Cardiovascular Disease | Admitting: Cardiovascular Disease

## 2020-05-13 DIAGNOSIS — R0781 Pleurodynia: Secondary | ICD-10-CM | POA: Diagnosis present

## 2020-05-13 DIAGNOSIS — R0789 Other chest pain: Secondary | ICD-10-CM | POA: Diagnosis not present

## 2020-05-13 DIAGNOSIS — I700 Atherosclerosis of aorta: Secondary | ICD-10-CM | POA: Insufficient documentation

## 2020-05-13 MED ORDER — METOPROLOL TARTRATE 5 MG/5ML IV SOLN: 5.0000 mg | INTRAVENOUS | Status: DC | PRN

## 2020-05-13 MED ORDER — METOPROLOL TARTRATE 5 MG/5ML IV SOLN
INTRAVENOUS | Status: AC
  Filled 2020-05-13: qty 15

## 2020-05-13 MED ORDER — NITROGLYCERIN 0.4 MG SL SUBL: 0.8000 mg | SUBLINGUAL_TABLET | Freq: Once | SUBLINGUAL | Status: AC

## 2020-05-13 MED ORDER — NITROGLYCERIN 0.4 MG SL SUBL
SUBLINGUAL_TABLET | SUBLINGUAL | Status: AC
  Administered 2020-05-13: 0.8 mg via SUBLINGUAL
  Filled 2020-05-13: qty 2

## 2020-05-13 MED ORDER — IOHEXOL 350 MG/ML SOLN
80.0000 mL | Freq: Once | INTRAVENOUS | Status: AC | PRN
  Administered 2020-05-13: 80 mL via INTRAVENOUS

## 2020-06-23 ENCOUNTER — Ambulatory Visit: Payer: Medicare (Managed Care) | Admitting: Dietician

## 2020-06-26 ENCOUNTER — Ambulatory Visit: Payer: Medicare (Managed Care) | Admitting: Dietician

## 2020-06-29 ENCOUNTER — Ambulatory Visit: Payer: Medicare (Managed Care) | Admitting: Cardiovascular Disease

## 2020-10-19 DIAGNOSIS — M25531 Pain in right wrist: Secondary | ICD-10-CM | POA: Diagnosis not present

## 2020-10-19 DIAGNOSIS — S20211A Contusion of right front wall of thorax, initial encounter: Secondary | ICD-10-CM | POA: Diagnosis not present

## 2020-10-19 DIAGNOSIS — W001XXA Fall from stairs and steps due to ice and snow, initial encounter: Secondary | ICD-10-CM | POA: Diagnosis not present

## 2020-10-19 DIAGNOSIS — S6991XA Unspecified injury of right wrist, hand and finger(s), initial encounter: Secondary | ICD-10-CM | POA: Diagnosis not present

## 2020-10-22 DIAGNOSIS — S52551A Other extraarticular fracture of lower end of right radius, initial encounter for closed fracture: Secondary | ICD-10-CM | POA: Diagnosis not present

## 2020-10-23 DIAGNOSIS — R109 Unspecified abdominal pain: Secondary | ICD-10-CM | POA: Diagnosis not present

## 2020-11-03 DIAGNOSIS — S52551P Other extraarticular fracture of lower end of right radius, subsequent encounter for closed fracture with malunion: Secondary | ICD-10-CM | POA: Diagnosis not present

## 2020-11-26 DIAGNOSIS — E1169 Type 2 diabetes mellitus with other specified complication: Secondary | ICD-10-CM | POA: Diagnosis not present

## 2020-12-01 DIAGNOSIS — S52551P Other extraarticular fracture of lower end of right radius, subsequent encounter for closed fracture with malunion: Secondary | ICD-10-CM | POA: Diagnosis not present

## 2021-01-05 DIAGNOSIS — S52551P Other extraarticular fracture of lower end of right radius, subsequent encounter for closed fracture with malunion: Secondary | ICD-10-CM | POA: Diagnosis not present

## 2021-02-02 DIAGNOSIS — S52551P Other extraarticular fracture of lower end of right radius, subsequent encounter for closed fracture with malunion: Secondary | ICD-10-CM | POA: Diagnosis not present

## 2021-02-15 DIAGNOSIS — E119 Type 2 diabetes mellitus without complications: Secondary | ICD-10-CM | POA: Diagnosis not present

## 2021-05-04 DIAGNOSIS — H26491 Other secondary cataract, right eye: Secondary | ICD-10-CM | POA: Diagnosis not present

## 2021-05-04 DIAGNOSIS — E119 Type 2 diabetes mellitus without complications: Secondary | ICD-10-CM | POA: Diagnosis not present

## 2021-05-04 DIAGNOSIS — H524 Presbyopia: Secondary | ICD-10-CM | POA: Diagnosis not present

## 2021-05-04 DIAGNOSIS — D3131 Benign neoplasm of right choroid: Secondary | ICD-10-CM | POA: Diagnosis not present

## 2021-05-24 DIAGNOSIS — E119 Type 2 diabetes mellitus without complications: Secondary | ICD-10-CM | POA: Diagnosis not present

## 2021-06-01 DIAGNOSIS — E1169 Type 2 diabetes mellitus with other specified complication: Secondary | ICD-10-CM | POA: Diagnosis not present

## 2021-07-07 DIAGNOSIS — Z0000 Encounter for general adult medical examination without abnormal findings: Secondary | ICD-10-CM | POA: Diagnosis not present

## 2021-07-08 DIAGNOSIS — R609 Edema, unspecified: Secondary | ICD-10-CM | POA: Diagnosis not present

## 2021-07-08 DIAGNOSIS — M25559 Pain in unspecified hip: Secondary | ICD-10-CM | POA: Diagnosis not present

## 2021-07-08 DIAGNOSIS — E1169 Type 2 diabetes mellitus with other specified complication: Secondary | ICD-10-CM | POA: Diagnosis not present

## 2021-10-12 DIAGNOSIS — E1165 Type 2 diabetes mellitus with hyperglycemia: Secondary | ICD-10-CM | POA: Diagnosis not present

## 2021-10-25 DIAGNOSIS — E1165 Type 2 diabetes mellitus with hyperglycemia: Secondary | ICD-10-CM | POA: Diagnosis not present

## 2021-11-11 DIAGNOSIS — E1165 Type 2 diabetes mellitus with hyperglycemia: Secondary | ICD-10-CM | POA: Diagnosis not present

## 2021-12-13 DIAGNOSIS — E1165 Type 2 diabetes mellitus with hyperglycemia: Secondary | ICD-10-CM | POA: Diagnosis not present

## 2022-01-06 DIAGNOSIS — E1169 Type 2 diabetes mellitus with other specified complication: Secondary | ICD-10-CM | POA: Diagnosis not present

## 2022-01-20 DIAGNOSIS — E1165 Type 2 diabetes mellitus with hyperglycemia: Secondary | ICD-10-CM | POA: Diagnosis not present

## 2022-01-24 DIAGNOSIS — H1033 Unspecified acute conjunctivitis, bilateral: Secondary | ICD-10-CM | POA: Diagnosis not present

## 2022-02-24 DIAGNOSIS — H905 Unspecified sensorineural hearing loss: Secondary | ICD-10-CM | POA: Diagnosis not present

## 2022-03-21 DIAGNOSIS — E1165 Type 2 diabetes mellitus with hyperglycemia: Secondary | ICD-10-CM | POA: Diagnosis not present

## 2022-04-27 DIAGNOSIS — E1165 Type 2 diabetes mellitus with hyperglycemia: Secondary | ICD-10-CM | POA: Diagnosis not present

## 2022-05-04 DIAGNOSIS — Z961 Presence of intraocular lens: Secondary | ICD-10-CM | POA: Diagnosis not present

## 2022-05-04 DIAGNOSIS — H0100B Unspecified blepharitis left eye, upper and lower eyelids: Secondary | ICD-10-CM | POA: Diagnosis not present

## 2022-05-04 DIAGNOSIS — H04123 Dry eye syndrome of bilateral lacrimal glands: Secondary | ICD-10-CM | POA: Diagnosis not present

## 2022-05-04 DIAGNOSIS — H0100A Unspecified blepharitis right eye, upper and lower eyelids: Secondary | ICD-10-CM | POA: Diagnosis not present

## 2022-05-28 DIAGNOSIS — E1165 Type 2 diabetes mellitus with hyperglycemia: Secondary | ICD-10-CM | POA: Diagnosis not present

## 2022-06-27 DIAGNOSIS — E1165 Type 2 diabetes mellitus with hyperglycemia: Secondary | ICD-10-CM | POA: Diagnosis not present

## 2022-07-03 IMAGING — DX DG CHEST 1V PORT
1 series · 1 of 1 positions shown · non-contrast
Comparison: Radiograph 04/07/2020

CLINICAL DATA: COVID.  Shortness of breath.  Increased weakness.

EXAM:
PORTABLE CHEST 1 VIEW

[chest ap]
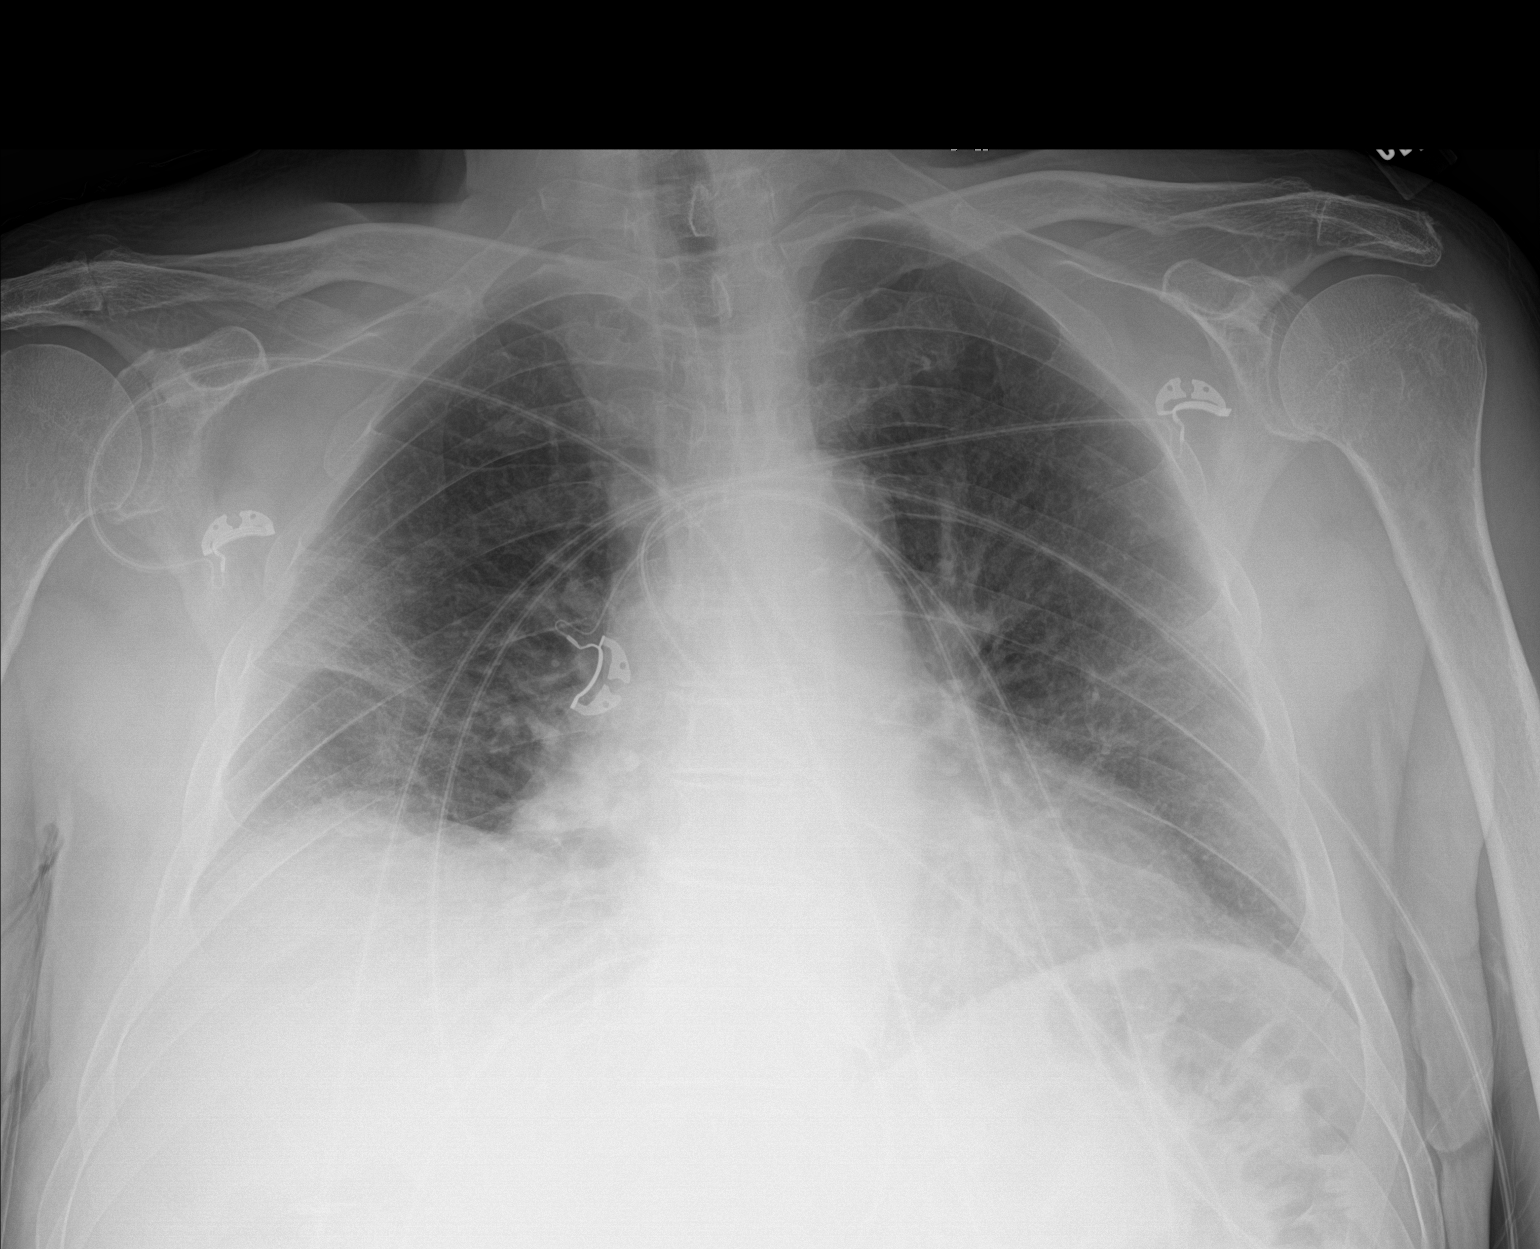

[1 of 1 positions shown; findings below may reference images not displayed]

FINDINGS: Heterogeneous bilateral airspace disease in the mid lower lung zone
predominant distribution, greatest in the right mid lung, with
slight progression over the past 3 days. Mild elevation of right
hemidiaphragm. Overall low lung volumes. Mild cardiomegaly with
unchanged mediastinal contours. Aortic atherosclerosis. No
pneumothorax or large pleural effusion.
IMPRESSION: Slight progression in bilateral airspace disease over the past 3
days consistent with COVID pneumonia.

## 2022-08-05 IMAGING — CT CT HEART MORP W/ CTA COR W/ SCORE W/ CA W/CM &/OR W/O CM
4 of 7 series · 8 of 20 positions shown, 9 images · IV contrast (APPLIED)
Comparison: None.
COMPARISON: None.

Addendum:
EXAM:
OVER-READ INTERPRETATION  CT CHEST

The following report is an over-read performed by radiologist Dr.
Ratna Herrmann [[email or web address]] on 05/13/2020. This
over-read does not include interpretation of cardiac or coronary
anatomy or pathology. The coronary calcium score/coronary CTA
interpretation by the cardiologist is attached.
CLINICAL DATA: 73M with diabetes, mild carotid stenosis and
atypical chest pain.
Cardiac/Coronary CT-A
TECHNIQUE: The patient was scanned on a [employer] Force scanner.

[Series 6: best diast 72 % · axial · 0.39mm/px · z∈[-165,-130]mm · 2 of 266 slices shown]
[im 89/266  vessel]
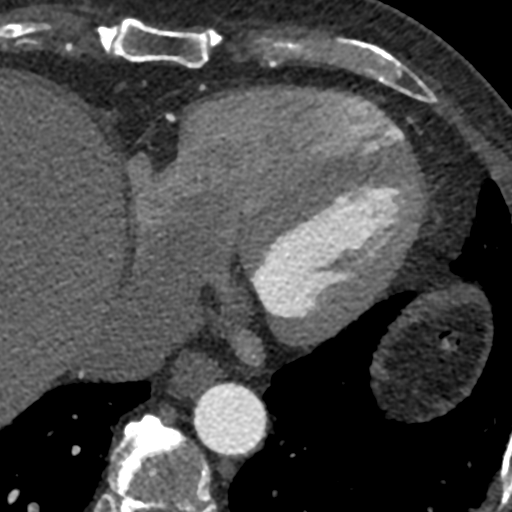
[im 177/266  vessel]
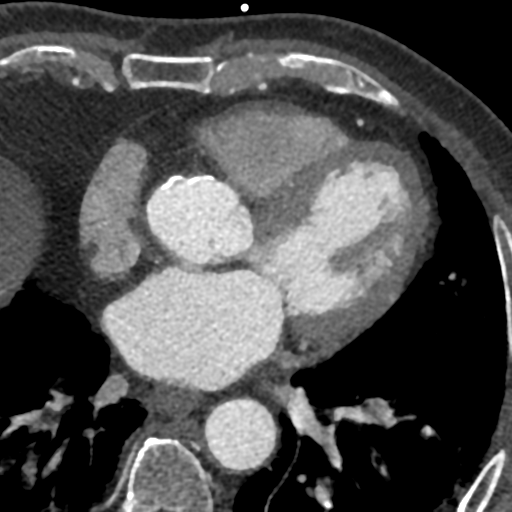

[Series 7: best syst · axial · 0.39mm/px · z∈[-165,-130]mm · 2 of 266 slices shown, 3 images]
[im 89/266  vessel]
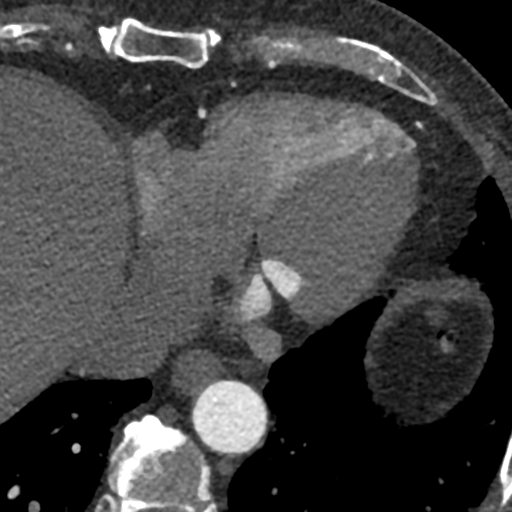
[im 89/266  lung]
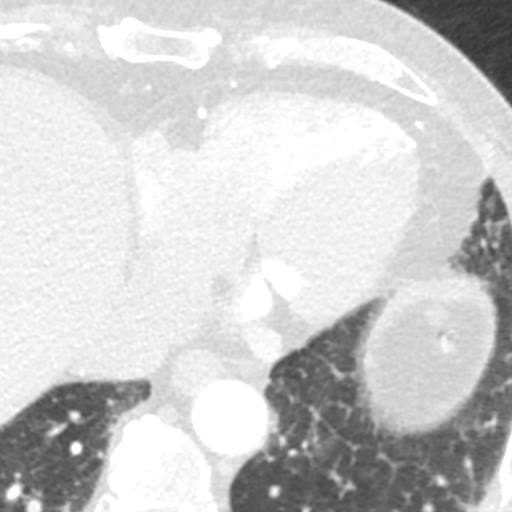
[im 177/266  vessel]
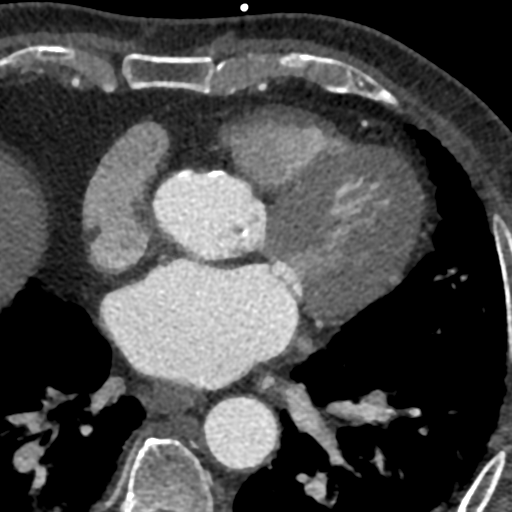

[Series 9: ts diast sharp 72 % · axial · 0.39mm/px · z∈[-165,-130]mm · 2 of 266 slices shown]
[im 89/266  lung]
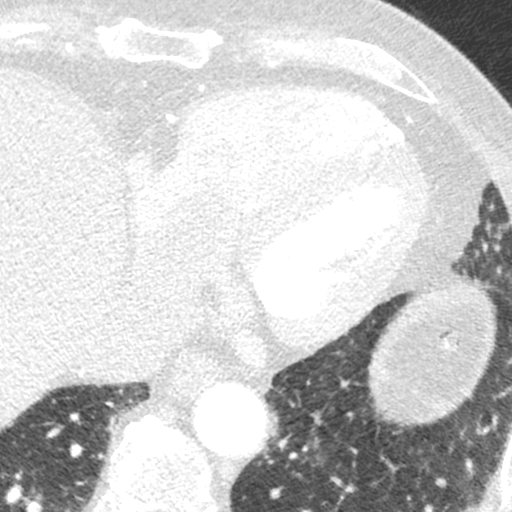
[im 177/266  lung]
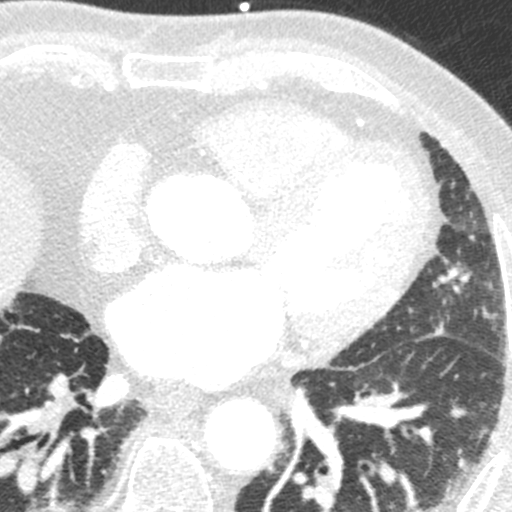

[Series 10: ts syst sharp · axial · 0.39mm/px · z∈[-165,-130]mm · 2 of 266 slices shown]
[im 89/266  lung]
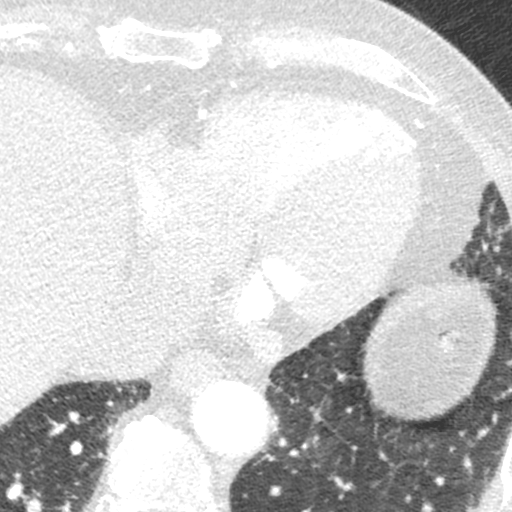
[im 177/266  lung]
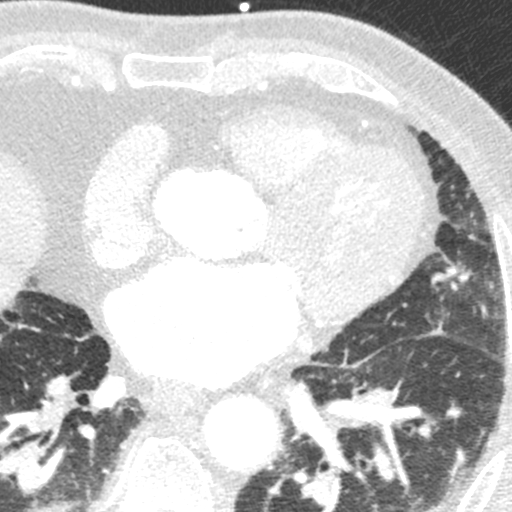

[8 of 20 positions shown; findings below may reference images not displayed]

FINDINGS: Widespread areas of peripheral predominant ground-glass attenuation,
septal thickening and some architectural distortion in the lungs
bilaterally. No definite suspicious appearing pulmonary nodules or
masses are confidently identified within the visualized portions of
the thorax. Aortic atherosclerosis. Multiple prominent borderline
enlarged and mildly enlarged mediastinal and right hilar lymph
nodes, measuring up to 2.0 cm in short axis in the right hilar nodal
station (axial image 2 of series 8). Within the visualized portions
of the thorax there are no pleural effusions and no evidence of
pneumothorax. Visualized portions of the upper abdomen are
unremarkable. There are no aggressive appearing lytic or blastic
lesions noted in the visualized portions of the skeleton.
IMPRESSION: 1. There is a spectrum of findings in the lungs which is compatible
with known COVID infection. The associated right hilar and
mediastinal lymphadenopathy is favored to be reactive. However,
repeat noncontrast chest CT is recommended in 3 months to ensure
regression of these findings.
2. Aortic Atherosclerosis (0JM83-ASZ.Z).
FINDINGS: A 120 kV prospective scan was triggered in the descending thoracic
aorta at 111 HU's. Axial non-contrast 3 mm slices were carried out
through the heart. The data set was analyzed on a dedicated work
station and scored using the Agatson method. Gantry rotation speed
was 250 msecs and collimation was .6 mm. No beta blockade and 0.8 mg
of sl NTG was given. The 3D data set was reconstructed in 5%
intervals of the 67-82 % of the R-R cycle. Diastolic phases were
analyzed on a dedicated work station using MPR, MIP and VRT modes.
The patient received 80 cc of contrast.

Aorta: Mild dilation of the ascending aorta. 3.cm. Mild
calcification of the ascending aorta. No dissection.

Aortic Valve:  Trileaflet.  Mild calcification.

Coronary Arteries:  Normal coronary origin.  Right dominance.

RCA is a large dominant artery that gives rise to PDA and PLVB.
There is no plaque.

Left main is a large artery that gives rise to LAD, RI, and LCX
arteries. There is minimal (<25%) calcified plaque.

LAD is a large vessel that has no plaque. There is a small D1 and
normal D2 with no plaque.

RI is a large vessel without plaque.

LCX is a non-dominant artery that gives rise to one large OM1
branch. There is minimal (<25%) calcified plaque. There are two
small OMs without plaque.

Other findings:

Normal pulmonary vein drainage into the left atrium.

Normal let atrial appendage without a thrombus.

Normal size of the pulmonary artery.
IMPRESSION: 1. Coronary calcium score of 32.2. This was 25th percentile for age
and sex matched control.

2. Normal coronary origin with right dominance.

3. Minimal (<25%) plaque in the LM and LCX.  CAD-RADS 1.

*** End of Addendum ***
EXAM:
OVER-READ INTERPRETATION  CT CHEST

The following report is an over-read performed by radiologist Dr.
Ratna Herrmann [[email or web address]] on 05/13/2020. This
over-read does not include interpretation of cardiac or coronary
anatomy or pathology. The coronary calcium score/coronary CTA
interpretation by the cardiologist is attached.
FINDINGS: Widespread areas of peripheral predominant ground-glass attenuation,
septal thickening and some architectural distortion in the lungs
bilaterally. No definite suspicious appearing pulmonary nodules or
masses are confidently identified within the visualized portions of
the thorax. Aortic atherosclerosis. Multiple prominent borderline
enlarged and mildly enlarged mediastinal and right hilar lymph
nodes, measuring up to 2.0 cm in short axis in the right hilar nodal
station (axial image 2 of series 8). Within the visualized portions
of the thorax there are no pleural effusions and no evidence of
pneumothorax. Visualized portions of the upper abdomen are
unremarkable. There are no aggressive appearing lytic or blastic
lesions noted in the visualized portions of the skeleton.
IMPRESSION: 1. There is a spectrum of findings in the lungs which is compatible
with known COVID infection. The associated right hilar and
mediastinal lymphadenopathy is favored to be reactive. However,
repeat noncontrast chest CT is recommended in 3 months to ensure
regression of these findings.
2. Aortic Atherosclerosis (0JM83-ASZ.Z).

## 2022-10-31 DIAGNOSIS — R109 Unspecified abdominal pain: Secondary | ICD-10-CM | POA: Diagnosis not present

## 2022-10-31 DIAGNOSIS — E1169 Type 2 diabetes mellitus with other specified complication: Secondary | ICD-10-CM | POA: Diagnosis not present

## 2022-10-31 DIAGNOSIS — I700 Atherosclerosis of aorta: Secondary | ICD-10-CM | POA: Diagnosis not present

## 2022-10-31 DIAGNOSIS — E119 Type 2 diabetes mellitus without complications: Secondary | ICD-10-CM | POA: Diagnosis not present

## 2022-10-31 DIAGNOSIS — E7800 Pure hypercholesterolemia, unspecified: Secondary | ICD-10-CM | POA: Diagnosis not present

## 2022-10-31 DIAGNOSIS — M25551 Pain in right hip: Secondary | ICD-10-CM | POA: Diagnosis not present

## 2022-12-05 ENCOUNTER — Other Ambulatory Visit: Payer: Self-pay | Admitting: Family Medicine

## 2022-12-05 ENCOUNTER — Ambulatory Visit
Admission: RE | Admit: 2022-12-05 | Discharge: 2022-12-05 | Disposition: A | Discharge status: Home / Self Care | Payer: 59 | Source: Ambulatory Visit | Attending: Family Medicine | Admitting: Family Medicine

## 2022-12-05 DIAGNOSIS — M25551 Pain in right hip: Secondary | ICD-10-CM

## 2023-04-10 ENCOUNTER — Emergency Department (HOSPITAL_BASED_OUTPATIENT_CLINIC_OR_DEPARTMENT_OTHER): Payer: 59

## 2023-04-10 ENCOUNTER — Other Ambulatory Visit: Payer: Self-pay

## 2023-04-10 ENCOUNTER — Emergency Department (HOSPITAL_COMMUNITY): Payer: 59

## 2023-04-10 ENCOUNTER — Encounter (HOSPITAL_BASED_OUTPATIENT_CLINIC_OR_DEPARTMENT_OTHER): Payer: Self-pay | Admitting: Emergency Medicine

## 2023-04-10 ENCOUNTER — Emergency Department (HOSPITAL_BASED_OUTPATIENT_CLINIC_OR_DEPARTMENT_OTHER)
Admission: EM | Admit: 2023-04-10 | Discharge: 2023-04-10 | Disposition: A | Discharge status: Home / Self Care | Payer: 59 | Attending: Emergency Medicine | Admitting: Emergency Medicine

## 2023-04-10 DIAGNOSIS — R5383 Other fatigue: Secondary | ICD-10-CM | POA: Diagnosis not present

## 2023-04-10 DIAGNOSIS — I700 Atherosclerosis of aorta: Secondary | ICD-10-CM | POA: Insufficient documentation

## 2023-04-10 DIAGNOSIS — I6501 Occlusion and stenosis of right vertebral artery: Secondary | ICD-10-CM | POA: Diagnosis not present

## 2023-04-10 DIAGNOSIS — R030 Elevated blood-pressure reading, without diagnosis of hypertension: Secondary | ICD-10-CM | POA: Diagnosis not present

## 2023-04-10 DIAGNOSIS — R269 Unspecified abnormalities of gait and mobility: Secondary | ICD-10-CM | POA: Diagnosis not present

## 2023-04-10 DIAGNOSIS — R42 Dizziness and giddiness: Secondary | ICD-10-CM | POA: Insufficient documentation

## 2023-04-10 DIAGNOSIS — I6509 Occlusion and stenosis of unspecified vertebral artery: Secondary | ICD-10-CM

## 2023-04-10 DIAGNOSIS — I6782 Cerebral ischemia: Secondary | ICD-10-CM | POA: Diagnosis not present

## 2023-04-10 DIAGNOSIS — I10 Essential (primary) hypertension: Secondary | ICD-10-CM | POA: Diagnosis not present

## 2023-04-10 DIAGNOSIS — I6381 Other cerebral infarction due to occlusion or stenosis of small artery: Secondary | ICD-10-CM | POA: Insufficient documentation

## 2023-04-10 DIAGNOSIS — R410 Disorientation, unspecified: Secondary | ICD-10-CM | POA: Diagnosis not present

## 2023-04-10 DIAGNOSIS — R2990 Unspecified symptoms and signs involving the nervous system: Secondary | ICD-10-CM

## 2023-04-10 DIAGNOSIS — R001 Bradycardia, unspecified: Secondary | ICD-10-CM | POA: Insufficient documentation

## 2023-04-10 DIAGNOSIS — Z7984 Long term (current) use of oral hypoglycemic drugs: Secondary | ICD-10-CM | POA: Diagnosis not present

## 2023-04-10 DIAGNOSIS — E119 Type 2 diabetes mellitus without complications: Secondary | ICD-10-CM | POA: Insufficient documentation

## 2023-04-10 DIAGNOSIS — R29818 Other symptoms and signs involving the nervous system: Secondary | ICD-10-CM | POA: Diagnosis not present

## 2023-04-10 DIAGNOSIS — J432 Centrilobular emphysema: Secondary | ICD-10-CM | POA: Insufficient documentation

## 2023-04-10 DIAGNOSIS — R55 Syncope and collapse: Secondary | ICD-10-CM | POA: Diagnosis not present

## 2023-04-10 DIAGNOSIS — R278 Other lack of coordination: Secondary | ICD-10-CM | POA: Diagnosis not present

## 2023-04-10 DIAGNOSIS — I6523 Occlusion and stenosis of bilateral carotid arteries: Secondary | ICD-10-CM | POA: Diagnosis not present

## 2023-04-10 LAB — DIFFERENTIAL
Abs Immature Granulocytes: 0.02 10*3/uL (ref 0.00–0.07)
Basophils Absolute: 0 10*3/uL (ref 0.0–0.1)
Basophils Relative: 1 %
Eosinophils Absolute: 0.1 10*3/uL (ref 0.0–0.5)
Eosinophils Relative: 1 %
Immature Granulocytes: 0 %
Lymphocytes Relative: 24 %
Lymphs Abs: 1.4 10*3/uL (ref 0.7–4.0)
Monocytes Absolute: 0.5 10*3/uL (ref 0.1–1.0)
Monocytes Relative: 8 %
Neutro Abs: 3.7 10*3/uL (ref 1.7–7.7)
Neutrophils Relative %: 66 %

## 2023-04-10 LAB — COMPREHENSIVE METABOLIC PANEL
ALT: 37 U/L (ref 0–44)
AST: 17 U/L (ref 15–41)
Albumin: 4.5 g/dL (ref 3.5–5.0)
Alkaline Phosphatase: 50 U/L (ref 38–126)
Anion gap: 10 (ref 5–15)
BUN: 14 mg/dL (ref 8–23)
CO2: 27 mmol/L (ref 22–32)
Calcium: 9.5 mg/dL (ref 8.9–10.3)
Chloride: 102 mmol/L (ref 98–111)
Creatinine, Ser: 0.74 mg/dL (ref 0.61–1.24)
GFR, Estimated: >60 mL/min (ref >60)
Glucose, Bld: 157 mg/dL — ABNORMAL HIGH (ref 70–99)
Potassium: 4 mmol/L (ref 3.5–5.1)
Sodium: 139 mmol/L (ref 135–145)
Total Bilirubin: 0.7 mg/dL (ref 0.3–1.2)
Total Protein: 7.2 g/dL (ref 6.5–8.1)

## 2023-04-10 LAB — CBC
HCT: 46.8 % (ref 39.0–52.0)
Hemoglobin: 15.7 g/dL (ref 13.0–17.0)
MCH: 30.7 pg (ref 26.0–34.0)
MCHC: 33.5 g/dL (ref 30.0–36.0)
MCV: 91.4 fL (ref 80.0–100.0)
Platelets: 166 10*3/uL (ref 150–400)
RBC: 5.12 MIL/uL (ref 4.22–5.81)
RDW: 12.8 % (ref 11.5–15.5)
WBC: 5.7 10*3/uL (ref 4.0–10.5)
nRBC: 0 % (ref 0.0–0.2)

## 2023-04-10 LAB — ETHANOL: Alcohol, Ethyl (B): <10 mg/dL (ref <10)

## 2023-04-10 LAB — APTT: aPTT: 31 seconds (ref 24–36)

## 2023-04-10 LAB — PROTIME-INR
INR: 1 (ref 0.8–1.2)
Prothrombin Time: 13.3 seconds (ref 11.4–15.2)

## 2023-04-10 LAB — CBG MONITORING, ED: Glucose-Capillary: 93 mg/dL (ref 70–99)

## 2023-04-10 MED ORDER — IOHEXOL 350 MG/ML SOLN
100.0000 mL | Freq: Once | INTRAVENOUS | Status: AC | PRN
  Administered 2023-04-10: 75 mL via INTRAVENOUS

## 2023-04-10 MED ORDER — METHYLPREDNISOLONE 4 MG PO TBPK: ORAL_TABLET | ORAL | 0 refills | Status: DC

## 2023-04-10 MED ORDER — SODIUM CHLORIDE 0.9% FLUSH: 3.0000 mL | Freq: Once | INTRAVENOUS | Status: DC

## 2023-04-10 NOTE — Discharge Instructions (Signed)
Your MRI imaging showed no evidence of acute stroke.  It did show evidence of old stroke for which you should follow-up outpatient with a neurologist.  IMPRESSION:  1. No evidence of acute intracranial abnormality.  2. 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

## 2023-04-10 NOTE — ED Provider Notes (Addendum)
[place] EMERGENCY DEPARTMENT AT [facility] Provider Note   CSN: [id number] Arrival date & time: 04/10/23  1222     History  Chief Complaint  Patient presents with   Dizziness   Balance Issues    Christopher Arnold is a 77 y.o. male.  Patient here with balance issues and unsteadiness for the last 24 hours.  Maybe a little bit of confusion.  Denies any chest pain or shortness of breath.  No arm or leg weakness or sensation changes.  No speech or vision changes.  He is felt in balance when he is walking.  He has not developed any intense nausea with the symptoms.  Does not seem to be positional.  He denies any headache.  Has a history of diabetes and high cholesterol.  No history of stroke.  Nothing is made it worse or better but he does think it is slightly improving.  He denies any ear pain or ringing in his ears.  The history is provided by the patient.       Home Medications Prior to Admission medications   Medication Sig Start Date End Date Taking? Authorizing Provider  acetaminophen (TYLENOL) 325 MG tablet Take 2 tablets (650 mg total) by mouth every 6 (six) hours as needed for mild pain or headache (fever >/= 101). 04/13/20   Elgergawy, Leana Roe, MD  dexamethasone (DECADRON) 6 MG tablet Take 1 tablet (6 mg total) by mouth daily. 04/14/20   Elgergawy, Leana Roe, MD  glimepiride (AMARYL) 1 MG tablet Take 1 mg by mouth daily. 02/24/17   [provider]  JARDIANCE 25 MG TABS tablet Take 25 mg by mouth daily. 05/16/18   [provider]  Linagliptin-Metformin HCl (JENTADUETO) 2.01-999 MG TABS Take 2 tablets by mouth daily.     [provider]  rosuvastatin (CRESTOR) 10 MG tablet Take 1 tablet (10 mg total) by mouth daily. 03/23/20 06/21/20  Chilton Si, MD      Allergies    Patient has no known allergies.    Review of Systems   Review of Systems  Physical Exam Updated Vital Signs BP (!) 161/71 (BP Location: Left Arm)   Pulse (!) 54   Temp  97.6 F (36.4 C)   Resp 16   Ht 5\' 7"  (1.702 m)   Wt 86.2 kg   SpO2 96%   BMI 29.76 kg/m  Physical Exam Vitals and nursing note reviewed.  Constitutional:      General: He is not in acute distress.    Appearance: He is well-developed.  HENT:     Head: Normocephalic and atraumatic.     Nose: Nose normal.     Mouth/Throat:     Mouth: Mucous membranes are moist.  Eyes:     Conjunctiva/sclera: Conjunctivae normal.     Pupils: Pupils are equal, round, and reactive to light.  Cardiovascular:     Rate and Rhythm: Normal rate and regular rhythm.     Heart sounds: No murmur heard. Pulmonary:     Effort: Pulmonary effort is normal. No respiratory distress.     Breath sounds: Normal breath sounds.  Abdominal:     Palpations: Abdomen is soft.     Tenderness: There is no abdominal tenderness.  Musculoskeletal:        General: No swelling.     Cervical back: Normal range of motion and neck supple.  Skin:    General: Skin is warm and dry.     Capillary Refill: Capillary refill takes  less than 2 seconds.  Neurological:     General: No focal deficit present.     Mental Status: He is alert and oriented to person, place, and time.     Cranial Nerves: No cranial nerve deficit.     Sensory: No sensory deficit.     Motor: No weakness.     Coordination: Coordination normal.     Gait: Gait abnormal.     Comments: 5+ out of 5 strength throughout, he struggles to walk feels imbalanced, normal finger-nose-finger, normal heel-to-shin, normal speech, normal visual fields, normal sensation, no speech issues  Psychiatric:        Mood and Affect: Mood normal.     ED Results / Procedures / Treatments   Labs (all labs ordered are listed, but only abnormal results are displayed) Labs Reviewed  COMPREHENSIVE METABOLIC PANEL - Abnormal; Notable for the following components:      Result Value   Glucose, Bld 157 (*)    All other components within normal limits  PROTIME-INR  APTT  CBC   DIFFERENTIAL  ETHANOL  CBG MONITORING, ED    EKG EKG Interpretation Date/Time:  Monday April 10 2023 12:53:37 EDT Ventricular Rate:  55 PR Interval:  162 QRS Duration:  82 QT Interval:  388 QTC Calculation: 371 R Axis:   68  Text Interpretation: Sinus bradycardia Anterolateral infarct , age undetermined Abnormal ECG  Nonspecific changes since prior Confirmed by Alvira Monday ([id number]) on 04/10/2023 2:06:07 PM  Radiology CT ANGIO HEAD NECK W WO CM  Result Date: 04/10/2023 CLINICAL DATA:  Provided history: Syncope/presyncope, cerebrovascular cause suspected. Acute onset balance difficulty, confusion, lightheadedness, dizziness. EXAM: CT ANGIOGRAPHY HEAD AND NECK WITH AND WITHOUT CONTRAST TECHNIQUE: Multidetector CT imaging of the head and neck was performed using the standard protocol during bolus administration of intravenous contrast. Multiplanar CT image reconstructions and MIPs were obtained to evaluate the vascular anatomy. Carotid stenosis measurements (when applicable) are obtained utilizing NASCET criteria, using the distal internal carotid diameter as the denominator. RADIATION DOSE REDUCTION: This exam was performed according to the departmental dose-optimization program which includes automated exposure control, adjustment of the mA and/or kV according to patient size and/or use of iterative reconstruction technique. CONTRAST:  75mL OMNIPAQUE IOHEXOL 350 MG/ML SOLN COMPARISON:  Head CT 05/14/2010. FINDINGS: CT HEAD FINDINGS Brain: Mild generalized cerebral atrophy. Age-indeterminate lacunar infarct within the right centrum semiovale (series 4, image 10). Chronic lacunar infarcts elsewhere within the bilateral cerebral hemispheric white matter, new from the prior head CT of 05/14/2010 Mild patchy and ill-defined hypoattenuation elsewhere within the cerebral white matter, nonspecific but compatible with chronic small vessel ischemic disease. These findings are also new from the prior head  CT. There is no acute intracranial hemorrhage. No demarcated cortical infarct. No extra-axial fluid collection. No evidence of an intracranial mass. No midline shift. Vascular: No hyperdense vessel.  Atherosclerotic calcifications. Skull: No calvarial fracture or aggressive osseous lesion. Sinuses/Orbits: No orbital mass or acute orbital finding. No significant paranasal sinus disease. Review of the MIP images confirms the above findings CTA NECK FINDINGS Aortic arch: The visualized thoracic aorta is normal in caliber. Common origin of the innominate and left common carotid arteries. Atherosclerotic plaque within the visualized aortic arch and proximal major branch vessels of the neck. No hemodynamically significant innominate or proximal subclavian artery stenosis. Right carotid system: CCA and ICA patent within the neck atherosclerotic plaque, greatest about the carotid bifurcation and within the proximal ICA. Up to 40% stenosis of the proximal ICA.  Left carotid system: CCA and ICA patent within the neck without stenosis. Mild atherosclerotic plaque at the CCA origin and about the carotid bifurcation. Tortuosity of the distal cervical ICA. Vertebral arteries: Venous reflux of contrast partially obscures the left vertebral artery V1 segment. Within this limitation, findings are as follows. The right vertebral artery is patent within the neck without stenosis. Non-stenotic atherosclerotic plaque at the origin of this vessel. The left vertebral artery is patent within the neck. Moderate/severe atherosclerotic narrowing at the origin of this vessel. Skeleton: Cervical spondylosis. No acute fracture or aggressive osseous lesion. Other neck: No neck mass or cervical lymphadenopathy. Upper chest: No consolidation within the imaged lung apices. Centrilobular and paraseptal emphysema. Review of the MIP images confirms the above findings CTA HEAD FINDINGS Anterior circulation: The intracranial internal carotid arteries are  patent. Atherosclerotic plaque within both vessels. Up to moderate stenosis within the left cavernous segment. Otherwise, there is no more than mild stenosis of the intracranial ICAs. The M1 middle cerebral arteries are patent. No M2 proximal branch occlusion or high-grade proximal stenosis. The anterior cerebral arteries are patent. No intracranial aneurysm is identified. Posterior circulation: The intracranial vertebral arteries are patent. The basilar artery is patent. The posterior cerebral arteries are patent. Moderate/severe stenosis within a left PCA branch at the P2/P3 junction. A left posterior communicating artery is present. The right posterior communicating artery is diminutive or absent. Venous sinuses: Within the limitations of contrast timing, no convincing thrombus. Anatomic variants: As described. Review of the MIP images confirms the above findings IMPRESSION: CT head: 1. Age-indeterminate lacunar infarct within the right frontal lobe centrum semiovale. Consider a brain MRI for further evaluation 2. Parenchymal atrophy, chronic small vessel ischemic disease and chronic lacunar infarcts, as described. CTA neck: 1. The common carotid and internal carotid arteries are patent within the neck. Atherosclerotic plaque bilaterally, as described. Most notably, there is up to 40% stenosis of the proximal right ICA. 2. Venous reflux of contrast partially obscures the left vertebral artery V1 segment. Within this limitation, the vertebral arteries are patent within the neck bilaterally. Moderate/severe atherosclerotic narrowing at the origin of the left vertebral artery. Non-stenotic atherosclerotic plaque at the origin of the right vertebral artery. 3. Aortic Atherosclerosis (ICD10-I70.0) and Emphysema (ICD10-J43.9). CTA head: 1. No intracranial large vessel occlusion is identified. 2. Intracranial atherosclerotic disease as described. Most notably, there is a moderate/severe stenosis within a left PCA branch  at the P2/P3 junction, and there is up to moderate stenosis of the cavernous left ICA. Electronically Signed   By: Jackey Loge D.O.   On: 04/10/2023 15:37    Procedures Procedures    Medications Ordered in ED Medications  sodium chloride flush (NS) 0.9 % injection 3 mL (has no administration in time range)  iohexol (OMNIPAQUE) 350 MG/ML injection 100 mL (75 mLs Intravenous Contrast Given 04/10/23 1402)    ED Course/ Medical Decision Making/ A&P                             Medical Decision Making Amount and/or Complexity of Data Reviewed Labs: ordered. Radiology: ordered.  Risk Prescription drug management.   Christopher Arnold is here with imbalance issues since yesterday at 3 PM.  About 24 hours ago.  His vital signs overall unremarkable except for some mild hypertension.  He had a little bit of confusion but really no other stroke symptoms.  Has a history of high cholesterol diabetes.  He appears  to have unremarkable vitals.  Normal orthostatics.  Has got normal strength and sensation throughout on exam.  Differential diagnosis could be stroke versus peripheral vertigo type process.  However he is not endorsing any intense nausea symptoms.  Not endorsing any issues with things getting worse with head movements or change of positions.  He is already had blood work done that showed no significant anemia or electrolyte abnormality or kidney injury or leukocytosis or electrolyte abnormality.  He appears well on exam.  However when he ambulates he is off balance.  CTA of the head and neck has been ordered but not resulted yet.  Per radiology report patient does have age-indeterminate lacunar infarct within the right frontal lobe centrum semiovale.  Otherwise she does have some moderate to severe stenosis of the left PCA branch at the P2 P3 junction.  I will reach out to Dr. Selina Cooley and Dr. Amada Jupiter with neurology for further recommendations.  Dr. Selina Cooley with neurology does recommend sending in  the [facility], ED for an MRI to further evaluate.  Patient made aware of this plan.  He was instructed to go directly to [facility] for MRI.  He understands that he might have to wait in the waiting room but does know that the MRI will get done while he is there physically. Pt to go POV. Stable. Dr Karene Fry aware.  This chart was dictated using voice recognition software.  Despite best efforts to proofread,  errors can occur which can change the documentation meaning.         Final Clinical Impression(s) / ED Diagnoses Final diagnoses:  Stroke-like symptoms    Rx / DC Orders ED Discharge Orders     None         Virgina Norfolk, DO 04/10/23 1550    Virgina Norfolk, DO 04/10/23 1554    Matan Steen, DO 04/10/23 1927

## 2023-04-10 NOTE — ED Provider Notes (Signed)
  Physical Exam  BP (!) 146/82   Pulse (!) 58   Temp 97.6 F (36.4 C)   Resp 20   Ht 5\' 7"  (1.702 m)   Wt 86.2 kg   SpO2 97%   BMI 29.76 kg/m     Procedures  Procedures  ED Course / MDM    Medical Decision Making Amount and/or Complexity of Data Reviewed Labs: ordered. Radiology: ordered.  Risk Prescription drug management.   77 year old male presenting to the emergency department in transfer ER to ER from [facility] due to concern for possible stroke.  Patient had presented with acute onset of balance issues confusion, lightheadedness and dizziness that started yesterday at 3 PM.  His CT imaging angio of the head and neck revealed the following: IMPRESSION:  CT head:    1. Age-indeterminate lacunar infarct within the right frontal lobe  centrum semiovale. Consider a brain MRI for further evaluation  2. Parenchymal atrophy, chronic small vessel ischemic disease and  chronic lacunar infarcts, as described.    CTA neck:    1. The common carotid and internal carotid arteries are patent  within the neck. Atherosclerotic plaque bilaterally, as described.  Most notably, there is up to 40% stenosis of the proximal right ICA.  2. Venous reflux of contrast partially obscures the left vertebral  artery V1 segment. Within this limitation, the vertebral arteries  are patent within the neck bilaterally. Moderate/severe  atherosclerotic narrowing at the origin of the left vertebral  artery. Non-stenotic atherosclerotic plaque at the origin of the  right vertebral artery.  3. Aortic Atherosclerosis (ICD10-I70.0) and Emphysema (ICD10-J43.9).    CTA head:    1. No intracranial large vessel occlusion is identified.  2. Intracranial atherosclerotic disease as described. Most notably,  there is a moderate/severe stenosis within a left PCA branch at the  P2/P3 junction, and there is up to moderate stenosis of the  cavernous left ICA.    MRI Brain: IMPRESSION:  1. No  evidence of acute intracranial abnormality.  2. 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    On repeat assessment, the patient was symptomatically improved.  I discussed the results of testing with on-call neurology, Dr. Derry Lory.  Agreed with plan for outpatient follow-up with neurology.       Ernie Avena, MD 04/10/23 [phone]

## 2023-04-10 NOTE — ED Triage Notes (Signed)
Pt arrives to ED with c/o acute onset of balance issues, confusion, lightheadedness, and dizziness that started yesterday at approx 3pm.

## 2023-04-10 NOTE — ED Notes (Signed)
Patient transported to MRI 

## 2023-05-03 ENCOUNTER — Encounter: Payer: Self-pay | Admitting: Neurology

## 2023-05-03 ENCOUNTER — Ambulatory Visit (INDEPENDENT_AMBULATORY_CARE_PROVIDER_SITE_OTHER): Payer: 59 | Admitting: Neurology

## 2023-05-03 VITALS — BP 136/74 | HR 60 | Ht 68.0 in | Wt 187.0 lb

## 2023-05-03 DIAGNOSIS — I639 Cerebral infarction, unspecified: Secondary | ICD-10-CM | POA: Diagnosis not present

## 2023-05-03 DIAGNOSIS — R269 Unspecified abnormalities of gait and mobility: Secondary | ICD-10-CM

## 2023-05-03 DIAGNOSIS — I69398 Other sequelae of cerebral infarction: Secondary | ICD-10-CM | POA: Diagnosis not present

## 2023-05-03 NOTE — Progress Notes (Signed)
[facility] [address] [place]. [place] [address] [phone]       OFFICE CONSULT NOTE  [name] Date of Birth:  July 21, 1946 Medical Record Number:  [id number]   Referring MD: Ernie Avena  Reason for Referral: Stroke  HPI: Christopher Arnold is a 77 year old [place] male seen today for initial office consultation visit for stroke.  He is accompanied by his son who translates for him as patient speaks limited [place].  History is obtained from them and review of electronic medical records and I personally reviewed pertinent available imaging films in PACS.  He has past medical history of diabetes and hyperlipidemia and osteoporosis.  He presented to emergency room on 04/10/2023 with a 2-day history of feeling of imbalance and some confusion dizziness and lightheadedness.  Initially felt this will get better so he tried to sleeping it off but next day symptoms persisted so he came to the ER.  CT scan of the head was obtained which showed age-indeterminate lacunar infarct in right frontal centrum semiovale and CT angiogram showed mild atherosclerotic plaque bilaterally with 40% proximal right ICA and moderate to severe left vertebral as well as left P2/P3 junction stenosis.  MRI was obtained subsequently which showed no acute infarct but confirmed remote bilateral frontoparietal white matter lacunar infarcts and changes of chronic small vessel disease.  Patient symptoms resolved and was discharged home.  He states is done well since then without recurrent symptoms.  He denies any prior history of symptoms suggestive of a stroke.  He is currently not on any aspirin.  He states his diabetes is under good control with fasting sugars ranging from 80-100 range though he has not seen a physician for years and has not had any recent A1c or lipid profile checked.  He was seen in October 2015 by Dr. Lucia Gaskins for right hand pain and weakness and diagnosed with severe carpal tunnel.  He  did not have any follow-up since then.  He is retired and is independent in activities of daily living.  He is not quite active and does not exercise regularly  ROS:   14 system review of systems is positive for dizziness, lightheadedness, imbalance, falling all other systems negative  PMH:  Past Medical History:  Diagnosis Date   COVID-19    Diabetes mellitus without complication (HCC)    Osteoporosis    Palpitations    Pleuritic chest pain 03/23/2020   Pneumonia due to COVID-19 virus    Pure hypercholesterolemia 03/23/2020    Social History:  Social History   Socioeconomic History   Marital status: Married    Spouse name: Not on file   Number of children: 3   Years of education: Lawyer   Highest education level: Not on file  Occupational History   Occupation: retired    Associate Professor: OTHER  Tobacco Use   Smoking status: Former    Current packs/day: 0.00    Types: Cigarettes    Quit date: 04/22/2005    Years since quitting: 18.0   Smokeless tobacco: Never  Substance and Sexual Activity   Alcohol use: Yes    Alcohol/week: 0.0 standard drinks of alcohol    Comment: occ   Drug use: No   Sexual activity: Not on file  Other Topics Concern   Not on file  Social History Narrative   The patient is married. He is a retired Pensions consultant. He is originally from [place]. He has a history of tobacco and alcohol use, but no longer smokes  and drinks alcohol only in social settings.   Caffeine use: none   Social Determinants of Corporate investment banker Strain: Not on file  Food Insecurity: Not on file  Transportation Needs: Not on file  Physical Activity: Not on file  Stress: Not on file  Social Connections: Not on file  Intimate Partner Violence: Not on file    Medications:   Current Outpatient Medications on File Prior to Visit  Medication Sig Dispense Refill   acetaminophen (TYLENOL) 325 MG tablet Take 2 tablets (650 mg total) by mouth every 6 (six) hours as needed for mild  pain or headache (fever >/= 101).     glimepiride (AMARYL) 1 MG tablet Take 1 mg by mouth daily.     JARDIANCE 25 MG TABS tablet Take 25 mg by mouth daily.  3   Linagliptin-Metformin HCl (JENTADUETO) 2.01-999 MG TABS Take 2 tablets by mouth daily.      No current facility-administered medications on file prior to visit.    Allergies:  No Known Allergies  Physical Exam General: well developed, well nourished elderly Caucasian male, seated, in no evident distress Head: head normocephalic and atraumatic.   Neck: supple with no carotid or supraclavicular bruits Cardiovascular: regular rate and rhythm, no murmurs Musculoskeletal: no deformity Skin:  no rash/petichiae Vascular:  Normal pulses all extremities  Neurologic Exam Mental Status: Awake and fully alert. Oriented to place and time. Recent and remote memory intact. Attention span, concentration and fund of knowledge appropriate. Mood and affect appropriate.  Cranial Nerves: Fundoscopic exam reveals sharp disc margins. Pupils equal, briskly reactive to light. Extraocular movements full without nystagmus. Visual fields full to confrontation. Hearing intact. Facial sensation intact. Face, tongue, palate moves normally and symmetrically.  Motor: Normal bulk and tone. Normal strength in all tested extremity muscles. Sensory.: intact to touch , pinprick , position and vibratory sensation.  Coordination: Rapid alternating movements normal in all extremities. Finger-to-nose and heel-to-shin performed accurately bilaterally. Gait and Station: Arises from chair without difficulty. Stance is normal. Gait demonstrates normal stride length and balance . Able to heel, toe and tandem walk with mild difficulty.  Reflexes: 1+ and symmetric. Toes downgoing.   NIHSS  0 Modified Rankin  1   ASSESSMENT: 77 year old Caucasian male with transient episode of gait imbalance likely due to small posterior circulation infarct from small vessel disease with  abnormal MRI showing multiple silent lacunar infarcts.  Vascular risk factors of diabetes, hypertension and hyperlipidemia     PLAN:I had a long d/w patient and his son about his recent stroke like episode and silent lacunar infarcts,, risk for recurrent stroke/TIAs, personally independently reviewed imaging studies and stroke evaluation results and answered questions.Startaspirin 81 mg daily  for secondary stroke prevention and maintain strict control of hypertension with blood pressure goal below 130/90, diabetes with hemoglobin A1c goal below 6.5% and lipids with LDL cholesterol goal below 70 mg/dL. I also advised the patient to eat a healthy diet with plenty of whole grains, cereals, fruits and vegetables, exercise regularly and maintain ideal body weight check lipid profile, hemoglobin A1c and echocardiogram.  Followup in the future with my nurse practitioner in 3 months or call earlier if necessary.  Greater than 50% time during this 45-minute consultation visit was spent on counseling and coordination of care about his lacunar strokes and discussion about stroke evaluation, prevention and treatment and answering questions. [name], MD  Note: This document was prepared with digital dictation and possible smart phrase technology. Any transcriptional  errors that result from this process are unintentional.

## 2023-05-03 NOTE — Patient Instructions (Signed)
I had a long d/w patient and his son about his recent stroke like episode and silent lacunar infarcts,, risk for recurrent stroke/TIAs, personally independently reviewed imaging studies and stroke evaluation results and answered questions.Startaspirin 81 mg daily  for secondary stroke prevention and maintain strict control of hypertension with blood pressure goal below 130/90, diabetes with hemoglobin A1c goal below 6.5% and lipids with LDL cholesterol goal below 70 mg/dL. I also advised the patient to eat a healthy diet with plenty of whole grains, cereals, fruits and vegetables, exercise regularly and maintain ideal body weight check lipid profile, hemoglobin A1c and echocardiogram.  Followup in the future with my nurse practitioner in 3 months or call earlier if necessary.  Stroke Prevention Some medical conditions and behaviors can lead to a higher chance of having a stroke. You can help prevent a stroke by eating healthy, exercising, not smoking, and managing any medical conditions you have. Stroke is a leading cause of functional impairment. Primary prevention is particularly important because a majority of strokes are first-time events. Stroke changes the lives of not only those who experience a stroke but also their family and other caregivers. How can this condition affect me? A stroke is a medical emergency and should be treated right away. A stroke can lead to brain damage and can sometimes be life-threatening. If a person gets medical treatment right away, there is a better chance of surviving and recovering from a stroke. What can increase my risk? The following medical conditions may increase your risk of a stroke: Cardiovascular disease. High blood pressure (hypertension). Diabetes. High cholesterol. Sickle cell disease. Blood clotting disorders (hypercoagulable state). Obesity. Sleep disorders (obstructive sleep apnea). Other risk factors include: Being older than age 64. Having a  history of blood clots, stroke, or mini-stroke (transient ischemic attack, TIA). Genetic factors, such as race, ethnicity, or a family history of stroke. Smoking cigarettes or using other tobacco products. Taking birth control pills, especially if you also use tobacco. Heavy use of alcohol or drugs, especially cocaine and methamphetamine. Physical inactivity. What actions can I take to prevent this? Manage your health conditions High cholesterol levels. Eating a healthy diet is important for preventing high cholesterol. If cholesterol cannot be managed through diet alone, you may need to take medicines. Take any prescribed medicines to control your cholesterol as told by your health care provider. Hypertension. To reduce your risk of stroke, try to keep your blood pressure below 130/80. Eating a healthy diet and exercising regularly are important for controlling blood pressure. If these steps are not enough to manage your blood pressure, you may need to take medicines. Take any prescribed medicines to control hypertension as told by your health care provider. Ask your health care provider if you should monitor your blood pressure at home. Have your blood pressure checked every year, even if your blood pressure is normal. Blood pressure increases with age and some medical conditions. Diabetes. Eating a healthy diet and exercising regularly are important parts of managing your blood sugar (glucose). If your blood sugar cannot be managed through diet and exercise, you may need to take medicines. Take any prescribed medicines to control your diabetes as told by your health care provider. Get evaluated for obstructive sleep apnea. Talk to your health care provider about getting a sleep evaluation if you snore a lot or have excessive sleepiness. Make sure that any other medical conditions you have, such as atrial fibrillation or atherosclerosis, are managed. Nutrition Follow instructions from your  health  care provider about what to eat or drink to help manage your health condition. These instructions may include: Reducing your daily calorie intake. Limiting how much salt (sodium) you use to 1,500 milligrams (mg) each day. Using only healthy fats for cooking, such as olive oil, canola oil, or sunflower oil. Eating healthy foods. You can do this by: Choosing foods that are high in fiber, such as whole grains, and fresh fruits and vegetables. Eating at least 5 servings of fruits and vegetables a day. Try to fill one-half of your plate with fruits and vegetables at each meal. Choosing lean protein foods, such as lean cuts of meat, poultry without skin, fish, tofu, beans, and nuts. Eating low-fat dairy products. Avoiding foods that are high in sodium. This can help lower blood pressure. Avoiding foods that have saturated fat, trans fat, and cholesterol. This can help prevent high cholesterol. Avoiding processed and prepared foods. Counting your daily carbohydrate intake.  Lifestyle If you drink alcohol: Limit how much you have to: 0-1 drink a day for women who are not pregnant. 0-2 drinks a day for men. Know how much alcohol is in your drink. In the [place] one drink equals one 12 oz bottle of beer ( ), one 5 oz glass of wine ( ), or one 1 oz glass of hard liquor (44mL). Do not use any products that contain nicotine or tobacco. These products include cigarettes, chewing tobacco, and vaping devices, such as e-cigarettes. If you need help quitting, ask your health care provider. Avoid secondhand smoke. Do not use drugs. Activity  Try to stay at a healthy weight. Get at least 30 minutes of exercise on most days, such as: Fast walking. Biking. Swimming. Medicines Take over-the-counter and prescription medicines only as told by your health care provider. Aspirin or blood thinners (antiplatelets or anticoagulants) may be recommended to reduce your risk of forming blood clots that  can lead to stroke. Avoid taking birth control pills. Talk to your health care provider about the risks of taking birth control pills if: You are over 85 years old. You smoke. You get very bad headaches. You have had a blood clot. Where to find more information American Stroke Association: www.strokeassociation.org Get help right away if: You or a loved one has any symptoms of a stroke. "BE FAST" is an easy way to remember the main warning signs of a stroke: B - Balance. Signs are dizziness, sudden trouble walking, or loss of balance. E - Eyes. Signs are trouble seeing or a sudden change in vision. F - Face. Signs are sudden weakness or numbness of the face, or the face or eyelid drooping on one side. A - Arms. Signs are weakness or numbness in an arm. This happens suddenly and usually on one side of the body. S - Speech. Signs are sudden trouble speaking, slurred speech, or trouble understanding what people say. T - Time. Time to call emergency services. Write down what time symptoms started. You or a loved one has other signs of a stroke, such as: A sudden, severe headache with no known cause. Nausea or vomiting. Seizure. These symptoms may represent a serious problem that is an emergency. Do not wait to see if the symptoms will go away. Get medical help right away. Call your local emergency services (911 in the [place] Do not drive yourself to the hospital. Summary You can help to prevent a stroke by eating healthy, exercising, not smoking, limiting alcohol intake, and managing any medical conditions you may have. Do  not use any products that contain nicotine or tobacco. These include cigarettes, chewing tobacco, and vaping devices, such as e-cigarettes. If you need help quitting, ask your health care provider. Remember "BE FAST" for warning signs of a stroke. Get help right away if you or a loved one has any of these signs. This information is not intended to replace advice given to you  by your health care provider. Make sure you discuss any questions you have with your health care provider. Document Revised: 08/01/2022 Document Reviewed: 08/01/2022 Elsevier Patient Education  2024 [employer].

## 2023-05-04 LAB — HEMOGLOBIN A1C
Est. average glucose Bld gHb Est-mCnc: 163 mg/dL
Hgb A1c MFr Bld: 7.3 % — ABNORMAL HIGH (ref 4.8–5.6)

## 2023-05-04 LAB — LIPID PANEL
Chol/HDL Ratio: 3.3 ratio (ref 0.0–5.0)
Cholesterol, Total: 150 mg/dL (ref 100–199)
HDL: 45 mg/dL (ref >39)
LDL Chol Calc (NIH): 84 mg/dL (ref 0–99)
Triglycerides: 113 mg/dL (ref 0–149)
VLDL Cholesterol Cal: 21 mg/dL (ref 5–40)

## 2023-05-15 ENCOUNTER — Other Ambulatory Visit: Payer: Self-pay | Admitting: Neurology

## 2023-05-15 MED ORDER — ATORVASTATIN CALCIUM 40 MG PO TABS: 40.0000 mg | ORAL_TABLET | Freq: Every day | ORAL | 3 refills | Status: DC

## 2023-05-15 NOTE — Progress Notes (Signed)
Kindly inform the patient that both screening test for diabetes as well as cholesterol profile is not satisfactory.  I recommend starting Lipitor 40 mg daily and also seeing primary care physician for tighter control of diabetes.

## 2023-05-18 ENCOUNTER — Telehealth: Payer: Self-pay

## 2023-05-18 NOTE — Telephone Encounter (Signed)
Called patient with an interpreter to inform patient of his results. LVM to give [place] a call back.

## 2023-05-18 NOTE — Telephone Encounter (Signed)
-----   Message from [name] sent at 05/15/2023  8:41 PM EDT ----- Christopher Arnold inform the patient that both screening test for diabetes as well as cholesterol profile is not satisfactory.  I recommend starting Lipitor 40 mg daily and also seeing primary care physician for tighter control of diabetes.

## 2023-05-19 DIAGNOSIS — E1169 Type 2 diabetes mellitus with other specified complication: Secondary | ICD-10-CM | POA: Diagnosis not present

## 2023-05-19 DIAGNOSIS — I679 Cerebrovascular disease, unspecified: Secondary | ICD-10-CM | POA: Diagnosis not present

## 2023-05-19 DIAGNOSIS — Z09 Encounter for follow-up examination after completed treatment for conditions other than malignant neoplasm: Secondary | ICD-10-CM | POA: Diagnosis not present

## 2023-05-19 DIAGNOSIS — E119 Type 2 diabetes mellitus without complications: Secondary | ICD-10-CM | POA: Diagnosis not present

## 2023-05-19 DIAGNOSIS — I779 Disorder of arteries and arterioles, unspecified: Secondary | ICD-10-CM | POA: Diagnosis not present

## 2023-05-24 NOTE — Telephone Encounter (Signed)
Called patient and informed him of his labs results. Patient mention he just had appointment with his PCP that explained his labs and he will pick up his lipitor 40mg  from his pharmacy. Pt verbalized understanding. Pt had no questions at this time but was encouraged to call back if questions arise.

## 2023-05-26 DIAGNOSIS — R509 Fever, unspecified: Secondary | ICD-10-CM | POA: Diagnosis not present

## 2023-05-26 DIAGNOSIS — R051 Acute cough: Secondary | ICD-10-CM | POA: Diagnosis not present

## 2023-05-26 DIAGNOSIS — U071 COVID-19: Secondary | ICD-10-CM | POA: Diagnosis not present

## 2023-06-02 ENCOUNTER — Ambulatory Visit (HOSPITAL_COMMUNITY): Payer: 59 | Attending: Neurology

## 2023-06-02 DIAGNOSIS — R002 Palpitations: Secondary | ICD-10-CM | POA: Insufficient documentation

## 2023-06-02 DIAGNOSIS — R42 Dizziness and giddiness: Secondary | ICD-10-CM | POA: Diagnosis not present

## 2023-06-02 DIAGNOSIS — Z87891 Personal history of nicotine dependence: Secondary | ICD-10-CM | POA: Insufficient documentation

## 2023-06-02 DIAGNOSIS — R079 Chest pain, unspecified: Secondary | ICD-10-CM | POA: Diagnosis not present

## 2023-06-02 DIAGNOSIS — I080 Rheumatic disorders of both mitral and aortic valves: Secondary | ICD-10-CM | POA: Diagnosis not present

## 2023-06-02 DIAGNOSIS — E785 Hyperlipidemia, unspecified: Secondary | ICD-10-CM | POA: Diagnosis not present

## 2023-06-02 DIAGNOSIS — E118 Type 2 diabetes mellitus with unspecified complications: Secondary | ICD-10-CM | POA: Insufficient documentation

## 2023-06-02 DIAGNOSIS — I340 Nonrheumatic mitral (valve) insufficiency: Secondary | ICD-10-CM

## 2023-06-02 DIAGNOSIS — I2510 Atherosclerotic heart disease of native coronary artery without angina pectoris: Secondary | ICD-10-CM | POA: Diagnosis not present

## 2023-06-02 DIAGNOSIS — I779 Disorder of arteries and arterioles, unspecified: Secondary | ICD-10-CM | POA: Diagnosis not present

## 2023-06-02 DIAGNOSIS — I69398 Other sequelae of cerebral infarction: Secondary | ICD-10-CM | POA: Insufficient documentation

## 2023-06-02 DIAGNOSIS — I639 Cerebral infarction, unspecified: Secondary | ICD-10-CM

## 2023-06-02 DIAGNOSIS — E119 Type 2 diabetes mellitus without complications: Secondary | ICD-10-CM | POA: Insufficient documentation

## 2023-06-02 DIAGNOSIS — R269 Unspecified abnormalities of gait and mobility: Secondary | ICD-10-CM | POA: Diagnosis not present

## 2023-06-02 LAB — ECHOCARDIOGRAM COMPLETE
Area-P 1/2: 3.46 cm2
S' Lateral: 2.6 cm

## 2023-06-09 NOTE — Progress Notes (Signed)
Kindly inform the patient that echocardiogram study was normal with normal pumping function of the heart and valvular function.

## 2023-06-15 NOTE — Telephone Encounter (Signed)
Called patient with a interpreter and LVM message to call back or check MyChart message.

## 2023-06-20 ENCOUNTER — Telehealth: Payer: Self-pay

## 2023-06-20 NOTE — Telephone Encounter (Signed)
-----   Message from [name] sent at 06/09/2023  5:03 PM EDT ----- Christopher Arnold inform the patient that echocardiogram study was normal with normal pumping function of the heart and valvular function.

## 2023-06-20 NOTE — Telephone Encounter (Signed)
-----   Message from [name] sent at 06/09/2023  5:03 PM EDT ----- Joneen Roach inform the patient that echocardiogram study was normal with normal pumping function of the heart and valvular function.

## 2023-06-20 NOTE — Telephone Encounter (Signed)
I spoke with the interpreter, Annabelle Harman [phone]) to contact the patient. Per DPR, she left a voicemail detailing normal results.

## 2023-06-20 NOTE — Telephone Encounter (Signed)
Called patient with an interpreter and LVM to give [place] a call back.

## 2023-07-28 ENCOUNTER — Other Ambulatory Visit: Payer: Self-pay | Admitting: Neurology

## 2023-08-03 ENCOUNTER — Encounter: Payer: Self-pay | Admitting: Adult Health

## 2023-08-03 ENCOUNTER — Ambulatory Visit: Payer: 59 | Admitting: Adult Health

## 2023-08-03 VITALS — BP 146/82 | HR 75 | Ht 68.0 in | Wt 193.0 lb

## 2023-08-03 DIAGNOSIS — I639 Cerebral infarction, unspecified: Secondary | ICD-10-CM

## 2023-08-03 NOTE — Progress Notes (Signed)
[facility] [address] [place]. [place] [address] [phone]       OFFICE FOLLOW UP NOTE  Christopher Arnold Date of Birth:  1946/06/12 Medical Record Number:  [id number]   Primary neurologist: Dr. Pearlean Brownie Reason for visit: Stroke follow-up   Chief Complaint  Patient presents with   Follow-up    Rm 3,  Pt is here for stroke follow up. Pt states he is doing well from stroke stand point, however he has noticed some memory lapses.     HPI:   Update 08/03/2023 JM: Patient returns for follow-up visit assisted by video interpreter, primarily speaks [place], limited [place].  Reports he has been doing well since prior visit.  He does report occasional memory difficulties such as at times will walk into a room and forget what he went in there for, usually will come back to him after a couple minutes, this has only occurred a few times, this has been present and stable since his stroke. Lives with his family.  Able to maintain ADLs and majority of IADLs independently.  He does not drive.  Denies new stroke/TIA symptoms.  Remains on aspirin 81 mg daily.  Never started on atorvastatin as previously recommended as he believes his cholesterol levels were slightly elevated after prior visit due to what he ate. He has been making changes in his diet and PCP routinely monitors. Has f/u with PCP in January with plans on repeat lipid panel, if his cholesterol levels are high he is wiling to start cholesterol medication if needed at that point. Completed echocardiogram 05/2023 which was overall normal with EF 61%.  No further questions or concerns at this time.      History provided for reference purposes only Consult visit 05/03/2023 Dr. Pearlean Brownie: Christopher Arnold is a 77 year old [place] male seen today for initial office consultation visit for stroke.  He is accompanied by his son who translates for him as patient speaks limited [place].  History is obtained from them and review of  electronic medical records and I personally reviewed pertinent available imaging films in PACS.  He has past medical history of diabetes and hyperlipidemia and osteoporosis.  He presented to emergency room on 04/10/2023 with a 2-day history of feeling of imbalance and some confusion dizziness and lightheadedness.  Initially felt this will get better so he tried to sleeping it off but next day symptoms persisted so he came to the ER.  CT scan of the head was obtained which showed age-indeterminate lacunar infarct in right frontal centrum semiovale and CT angiogram showed mild atherosclerotic plaque bilaterally with 40% proximal right ICA and moderate to severe left vertebral as well as left P2/P3 junction stenosis.  MRI was obtained subsequently which showed no acute infarct but confirmed remote bilateral frontoparietal white matter lacunar infarcts and changes of chronic small vessel disease.  Patient symptoms resolved and was discharged home.  He states is done well since then without recurrent symptoms.  He denies any prior history of symptoms suggestive of a stroke.  He is currently not on any aspirin.  He states his diabetes is under good control with fasting sugars ranging from 80-100 range though he has not seen a physician for years and has not had any recent A1c or lipid profile checked.  He was seen in October 2015 by Dr. Lucia Gaskins for right hand pain and weakness and diagnosed with severe carpal tunnel.  He did not have any follow-up since then.  He is retired and is  independent in activities of daily living.  He is not quite active and does not exercise regularly    ROS:   14 system review of systems is positive for those listed in HPI and all other systems negative  PMH:  Past Medical History:  Diagnosis Date   COVID-19    Diabetes mellitus without complication (HCC)    Osteoporosis    Palpitations    Pleuritic chest pain 03/23/2020   Pneumonia due to COVID-19 virus    Pure hypercholesterolemia  03/23/2020    Social History:  Social History   Socioeconomic History   Marital status: Married    Spouse name: Not on file   Number of children: 3   Years of education: Lawyer   Highest education level: Not on file  Occupational History   Occupation: retired    Associate Professor: OTHER  Tobacco Use   Smoking status: Former    Current packs/day: 0.00    Types: Cigarettes    Quit date: 04/22/2005    Years since quitting: 18.2   Smokeless tobacco: Never  Substance and Sexual Activity   Alcohol use: Yes    Alcohol/week: 0.0 standard drinks of alcohol    Comment: occ   Drug use: No   Sexual activity: Not on file  Other Topics Concern   Not on file  Social History Narrative   The patient is married. He is a retired Pensions consultant. He is originally from [place]. He has a history of tobacco and alcohol use, but no longer smokes and drinks alcohol only in social settings.   Caffeine use: none   Social Determinants of Corporate investment banker Strain: Not on file  Food Insecurity: Not on file  Transportation Needs: Not on file  Physical Activity: Not on file  Stress: Not on file  Social Connections: Not on file  Intimate Partner Violence: Not on file    Medications:   Current Outpatient Medications on File Prior to Visit  Medication Sig Dispense Refill   glimepiride (AMARYL) 1 MG tablet Take 1 mg by mouth daily with breakfast.     JARDIANCE 25 MG TABS tablet Take 25 mg by mouth daily.  3   Linagliptin-Metformin HCl (JENTADUETO) 2.01-999 MG TABS Take 2 tablets by mouth daily.      atorvastatin (LIPITOR) 40 MG tablet TAKE 1 TABLET BY MOUTH EVERY DAY (Patient not taking: Reported on 08/03/2023) 90 tablet 1   No current facility-administered medications on file prior to visit.    Allergies:  No Known Allergies  Physical Exam Today's Vitals   08/03/23 1247  BP: (!) 146/82  Pulse: 75  Weight: 193 lb (87.5 kg)  Height: 5\' 8"  (1.727 m)   Body mass index is 29.35 kg/m.  General:  well developed, well nourished elderly Caucasian male, seated, in no evident distress  Neurologic Exam Mental Status: Awake and fully alert.  Limited English, denies word finding difficulty or slurred speech.  Oriented to place and time. Recent memory subjectively mildly impaired and remote memory intact. Attention span, concentration and fund of knowledge appropriate. Mood and affect appropriate.  Cranial Nerves: Pupils equal, briskly reactive to light. Extraocular movements full without nystagmus. Visual fields full to confrontation. Hearing intact. Facial sensation intact. Face, tongue, palate moves normally and symmetrically.  Motor: Normal bulk and tone. Normal strength in all tested extremity muscles. Sensory.: intact to touch , pinprick , position and vibratory sensation.  Coordination: Rapid alternating movements normal in all extremities. Finger-to-nose and heel-to-shin performed accurately bilaterally.  Gait and Station: Arises from chair without difficulty. Stance is normal. Gait demonstrates normal stride length and balance without use of AD Reflexes: 1+ and symmetric. Toes downgoing.        ASSESSMENT: 77 year old Caucasian male with transient episode of gait imbalance likely due to small posterior circulation infarct from small vessel disease with abnormal MRI showing multiple silent lacunar infarcts.  Vascular risk factors of diabetes, hypertension and hyperlipidemia     PLAN:  1.  Ischemic stroke  -Mild short-term memory difficulties occasionally.  Advised to continue to monitor. Recommend routine physical activity and exercise as well as cognitive exercises.  -Continue aspirin 81 mg daily secondary stroke prevention measures. Declines statin therapy, would be willing to start if repeat cholesterol levels in January are elevated otherwise he does not feel this is needed. Discussed indication of statin therapy lowering stroke risk, he verbalized understanding and still  (politely) declines  -Continue close PCP follow-up for aggressive stroke risk factor management including BP goal<130/90, HLD with LDL goal<70 and DM with A1c.<7   -Stroke labs 04/2023: A1c 7.3, LDL 84 (atorvastatin initiated and advised f/u with PCP for better DM management) - plans on repeat labs with PCP in January    Doing well from stroke standpoint without further recommendations and risk factors are managed by PCP. He may follow up PRN, as usual for our patients who are strictly being followed for stroke. If any new neurological issues should arise, request PCP place referral for evaluation by one of our neurologists. Thank you.      I spent 30 minutes of face-to-face and non-face-to-face time with patient assisted by video interpreter.  This included previsit chart review, lab review, study review, order entry, electronic health record documentation, patient education and discussion regarding above diagnoses and treatment plan and answered all other questions to patient's satisfaction  Ihor Austin, [facility]  [facility] [address] [place], [place] [address]  Phone [phone] Fax [phone] Note: This document was prepared with digital dictation and possible smart phrase technology. Any transcriptional errors that result from this process are unintentional.

## 2023-08-03 NOTE — Patient Instructions (Addendum)
Continue to monitor memory, suspect related to your prior stroke, ensure you are staying active and doing routine memory exercises such as crossword puzzles, word search, sodoku, card games, etc. Ensure good management of your vascular risk factors such as blood pressure, cholesterol and diabetes   Continue aspirin 81 mg daily for secondary stroke prevention, would also recommend starting atorvastatin (Lipitor) as recommended by Dr. Pearlean Brownie for stroke prevention but this can be further discussed with your PCP   Continue to follow up with PCP regarding blood pressure, diabetes and cholesterol management  Maintain strict control of hypertension with blood pressure goal below 130/90, diabetes with hemoglobin A1c goal below 7.0 % and cholesterol with LDL cholesterol (bad cholesterol) goal below 70 mg/dL.   Signs of a Stroke? Follow the BEFAST method:  Balance Watch for a sudden loss of balance, trouble with coordination or vertigo Eyes Is there a sudden loss of vision in one or both eyes? Or double vision?  Face: Ask the person to smile. Does one side of the face droop or is it numb?  Arms: Ask the person to raise both arms. Does one arm drift downward? Is there weakness or numbness of a leg? Speech: Ask the person to repeat a simple phrase. Does the speech sound slurred/strange? Is the person confused ? Time: If you observe any of these signs, call 911.        Thank you for coming to see [place] at [facility]. I hope we have been able to provide you high quality care today.  You may receive a patient satisfaction survey over the next few weeks. We would appreciate your feedback and comments so that we may continue to improve ourselves and the health of our patients.

## 2023-09-12 DIAGNOSIS — H52203 Unspecified astigmatism, bilateral: Secondary | ICD-10-CM | POA: Diagnosis not present

## 2023-09-12 DIAGNOSIS — E119 Type 2 diabetes mellitus without complications: Secondary | ICD-10-CM | POA: Diagnosis not present

## 2023-09-12 DIAGNOSIS — D3131 Benign neoplasm of right choroid: Secondary | ICD-10-CM | POA: Diagnosis not present

## 2023-09-12 DIAGNOSIS — H5213 Myopia, bilateral: Secondary | ICD-10-CM | POA: Diagnosis not present

## 2023-09-12 DIAGNOSIS — H26493 Other secondary cataract, bilateral: Secondary | ICD-10-CM | POA: Diagnosis not present

## 2023-11-21 DIAGNOSIS — Z0000 Encounter for general adult medical examination without abnormal findings: Secondary | ICD-10-CM | POA: Diagnosis not present

## 2023-11-21 DIAGNOSIS — I779 Disorder of arteries and arterioles, unspecified: Secondary | ICD-10-CM | POA: Diagnosis not present

## 2023-11-21 DIAGNOSIS — I700 Atherosclerosis of aorta: Secondary | ICD-10-CM | POA: Diagnosis not present

## 2023-11-21 DIAGNOSIS — M810 Age-related osteoporosis without current pathological fracture: Secondary | ICD-10-CM | POA: Diagnosis not present

## 2023-11-21 DIAGNOSIS — E7800 Pure hypercholesterolemia, unspecified: Secondary | ICD-10-CM | POA: Diagnosis not present

## 2023-11-21 DIAGNOSIS — E1169 Type 2 diabetes mellitus with other specified complication: Secondary | ICD-10-CM | POA: Diagnosis not present

## 2024-01-09 ENCOUNTER — Other Ambulatory Visit: Payer: Self-pay | Admitting: Neurology

## 2024-05-29 DIAGNOSIS — E1169 Type 2 diabetes mellitus with other specified complication: Secondary | ICD-10-CM | POA: Diagnosis not present

## 2024-05-29 DIAGNOSIS — R350 Frequency of micturition: Secondary | ICD-10-CM | POA: Diagnosis not present

## 2024-06-12 ENCOUNTER — Other Ambulatory Visit: Payer: Self-pay | Admitting: Neurology

## 2024-06-17 NOTE — Telephone Encounter (Signed)
 Rx is not in current med list.  (Patient not taking: Reported on 08/03/2023)   Rx refused.

## 2024-06-23 ENCOUNTER — Other Ambulatory Visit: Payer: Self-pay | Admitting: Neurology
